# Patient Record
Sex: Female | Born: 1965 | Race: White | Hispanic: No | Marital: Married | Smoking: Never smoker
Health system: Southern US, Community
[De-identification: ages and names within clinical notes are randomized; demographics above are authoritative.]

## PROBLEM LIST (undated history)

## (undated) DIAGNOSIS — I4729 Other ventricular tachycardia: Secondary | ICD-10-CM

## (undated) DIAGNOSIS — F419 Anxiety disorder, unspecified: Secondary | ICD-10-CM

## (undated) DIAGNOSIS — J45909 Unspecified asthma, uncomplicated: Secondary | ICD-10-CM

## (undated) DIAGNOSIS — T753XXA Motion sickness, initial encounter: Secondary | ICD-10-CM

## (undated) DIAGNOSIS — I472 Ventricular tachycardia: Secondary | ICD-10-CM

## (undated) DIAGNOSIS — K219 Gastro-esophageal reflux disease without esophagitis: Secondary | ICD-10-CM

## (undated) DIAGNOSIS — R002 Palpitations: Secondary | ICD-10-CM

## (undated) DIAGNOSIS — I10 Essential (primary) hypertension: Secondary | ICD-10-CM

## (undated) DIAGNOSIS — Z8489 Family history of other specified conditions: Secondary | ICD-10-CM

## (undated) HISTORY — PX: CERVICAL FUSION: SHX112

## (undated) HISTORY — PX: GASTRIC BYPASS: SHX52

## (undated) HISTORY — PX: CHOLECYSTECTOMY: SHX55

---

## 2006-07-29 ENCOUNTER — Ambulatory Visit: Payer: Self-pay | Admitting: Unknown Physician Specialty

## 2007-10-11 ENCOUNTER — Ambulatory Visit: Payer: Self-pay | Admitting: Unknown Physician Specialty

## 2008-12-04 ENCOUNTER — Ambulatory Visit: Payer: Self-pay | Admitting: Unknown Physician Specialty

## 2009-12-11 ENCOUNTER — Ambulatory Visit: Payer: Self-pay | Admitting: Unknown Physician Specialty

## 2011-01-06 ENCOUNTER — Ambulatory Visit: Payer: Self-pay | Admitting: Unknown Physician Specialty

## 2012-01-13 ENCOUNTER — Ambulatory Visit: Payer: Self-pay | Admitting: Obstetrics and Gynecology

## 2013-01-08 DIAGNOSIS — E669 Obesity, unspecified: Secondary | ICD-10-CM | POA: Insufficient documentation

## 2015-04-03 LAB — HM PAP SMEAR: HM Pap smear: NEGATIVE

## 2015-09-25 DIAGNOSIS — M542 Cervicalgia: Secondary | ICD-10-CM | POA: Insufficient documentation

## 2015-09-25 DIAGNOSIS — R519 Headache, unspecified: Secondary | ICD-10-CM | POA: Insufficient documentation

## 2015-09-25 DIAGNOSIS — R030 Elevated blood-pressure reading, without diagnosis of hypertension: Secondary | ICD-10-CM | POA: Insufficient documentation

## 2016-12-30 DIAGNOSIS — R002 Palpitations: Secondary | ICD-10-CM | POA: Insufficient documentation

## 2017-08-17 ENCOUNTER — Ambulatory Visit
Admission: EM | Admit: 2017-08-17 | Discharge: 2017-08-17 | Disposition: A | Discharge status: Home / Self Care | Payer: BLUE CROSS/BLUE SHIELD | Attending: Family Medicine | Admitting: Family Medicine

## 2017-08-17 DIAGNOSIS — W268XXA Contact with other sharp object(s), not elsewhere classified, initial encounter: Secondary | ICD-10-CM | POA: Diagnosis not present

## 2017-08-17 DIAGNOSIS — S61216S Laceration without foreign body of right little finger without damage to nail, sequela: Secondary | ICD-10-CM

## 2017-08-17 HISTORY — DX: Other ventricular tachycardia: I47.29

## 2017-08-17 HISTORY — DX: Palpitations: R00.2

## 2017-08-17 HISTORY — DX: Ventricular tachycardia: I47.2

## 2017-08-17 HISTORY — DX: Unspecified asthma, uncomplicated: J45.909

## 2017-08-17 HISTORY — DX: Essential (primary) hypertension: I10

## 2017-08-17 HISTORY — DX: Anxiety disorder, unspecified: F41.9

## 2017-08-17 MED ORDER — LIDOCAINE-EPINEPHRINE-TETRACAINE (LET) SOLUTION
3.0000 mL | Freq: Once | NASAL | Status: AC
  Administered 2017-08-17: 3 mL via TOPICAL

## 2017-08-17 NOTE — ED Triage Notes (Signed)
Patient was slicing zucchini on a mandolin tonight and cut the tip of her right pinky finger. Patient states that area is continuing to bleed.

## 2017-08-17 NOTE — ED Provider Notes (Signed)
[facility]    CSN: [id number] Arrival date & time: 08/17/17  1819     History   Chief Complaint Chief Complaint  Patient presents with  . Extremity Laceration    HPI AHLAM PISCITELLI is a 51 y.o. female.   HPI  Subjective 51 year old female who is accompanied by her husband. She states she was slicing zucchini on a mandolin tonight she inadvertently cut off the ulnar aspect of her officially of the epidermis of her right fifth finger. The area keeps bleeding despite elevation and pressure. She does have a tissue loss in that area.       Past Medical History:  Diagnosis Date  . Anxiety   . Asthma   . Heart palpitations   . Hypertension   . NSVT (nonsustained ventricular tachycardia) (HCC)     There are no active problems to display for this patient.   History reviewed. No pertinent surgical history.  OB History    No data available       Home Medications    Prior to Admission medications   Medication Sig Start Date End Date Taking? Authorizing Provider  albuterol (PROVENTIL HFA;VENTOLIN HFA) 108 (90 Base) MCG/ACT inhaler Inhale 1-2 puffs into the lungs every 6 (six) hours as needed for wheezing or shortness of breath.   Yes [provider]  cholecalciferol (VITAMIN D) 1000 units tablet Take 2,000 Units by mouth daily.   Yes [provider]  cyclobenzaprine (FLEXERIL) 10 MG tablet Take 5 mg by mouth 3 (three) times daily as needed for muscle spasms.   Yes [provider]  DULoxetine (CYMBALTA) 60 MG capsule Take 60 mg by mouth daily.   Yes [provider]  Fluticasone-Salmeterol (ADVAIR) 250-50 MCG/DOSE AEPB Inhale 1 puff into the lungs 2 (two) times daily.   Yes [provider]  LORazepam (ATIVAN) 0.5 MG tablet Take 0.5 mg by mouth every 4 (four) hours as needed for anxiety.   Yes [provider]  metoprolol succinate (TOPROL-XL) 25 MG 24 hr tablet Take 12.5 mg by mouth daily.   Yes [provider]  Multiple Vitamin (MULTIVITAMIN) tablet Take 1 tablet by mouth daily.   Yes [provider]  ranitidine (ZANTAC) 150 MG capsule Take 150 mg by mouth 2 (two) times daily.   Yes [provider]  vitamin B-12 (CYANOCOBALAMIN) 1000 MCG tablet Take 1,000 mcg by mouth daily.   Yes [provider]    Family History History reviewed. No pertinent family history.  Social History Social History  Substance Use Topics  . Smoking status: Never Smoker  . Smokeless tobacco: Never Used  . Alcohol use No     Allergies   Patient has no known allergies.   Review of Systems Review of Systems  Constitutional: Positive for activity change. Negative for appetite change, chills, fatigue and fever.  Skin: Positive for wound.  All other systems reviewed and are negative.    Physical Exam Triage Vital Signs ED Triage Vitals  Enc Vitals Group     BP 08/17/17 1835 126/69     Pulse Rate 08/17/17 1835 76     Resp 08/17/17 1835 16     Temp 08/17/17 1835 98.1 F (36.7 C)     Temp Source 08/17/17 1835 Oral     SpO2 08/17/17 1835 100 %     Weight --      Height --      Head Circumference --      Peak  Flow --      Pain Score 08/17/17 1834 8     Pain Loc --      Pain Edu? --      Excl. in GC? --    No data found.   Updated Vital Signs BP 126/69 (BP Location: Left Arm)   Pulse 76   Temp 98.1 F (36.7 C) (Oral)   Resp 16   SpO2 100%   Visual Acuity Right Eye Distance:   Left Eye Distance:   Bilateral Distance:    Right Eye Near:   Left Eye Near:    Bilateral Near:     Physical Exam  Constitutional: She is oriented to person, place, and time. She appears well-developed and well-nourished. No distress.  HENT:  Head: Normocephalic.  Eyes: Pupils are equal, round, and reactive to light.  Neck: Normal range of motion.  Musculoskeletal: Normal range of motion.  Neurological: She is alert and oriented to person, place, and time.  Skin: Skin is  warm and dry. She is not diaphoretic.  Examination of the right dominant little finger shows a small avulsion laceration of the distal ulnar volar tip measuring roughly 7 mm.x 2 mm. the dermis is intact. There is tissue loss that is not recoverable.  Psychiatric: She has a normal mood and affect. Her behavior is normal. Judgment and thought content normal.  Nursing note and vitals reviewed.    UC Treatments / Results  Labs (all labs ordered are listed, but only abnormal results are displayed) Labs Reviewed - No data to display  EKG  EKG Interpretation None       Radiology No results found.  Procedures Procedures (including critical care time) Bleeding was attempted to be controlled with elevation and pressure. This was held for a full ten- 15 minutes without success. LET was then applied and held in place for approximately 20 minutes with complete resolution of the bleeding. The bleeding was controlled the wound was covered with Dermabond out to dry. A dry sterile dressing was then applied which the patient will use as necessary.      Medications Ordered in UC Medications  lidocaine-EPINEPHrine-tetracaine (LET) solution (3 mLs Topical Given 08/17/17 1951)     Initial Impression / Assessment and Plan / UC Course  I have reviewed the triage vital signs and the nursing notes.  Pertinent labs & imaging results that were available during my care of the patient were reviewed by me and considered in my medical decision making (see chart for details).     Plan: 1. Test/x-ray results and diagnosis reviewed with patient 2. rx as per orders; risks, benefits, potential side effects reviewed with patient 3. Recommend supportive treatment with allowing the Dermabond to shed on its own. Develops any signs or symptoms of infection return to our clinic immediately. 4. F/u prn if symptoms worsen or don't improve   Final Clinical Impressions(s) / UC Diagnoses   Final diagnoses:    Laceration of right little finger without foreign body without damage to nail, sequela    New Prescriptions Discharge Medication List as of 08/17/2017  8:16 PM       Controlled Substance Prescriptions [place] Controlled Substance Registry consulted? Not Applicable   Lutricia FeilRoemer, Neave Lenger P, PA-C 08/17/17 2040

## 2017-10-10 ENCOUNTER — Encounter: Payer: Self-pay | Admitting: Emergency Medicine

## 2017-10-10 ENCOUNTER — Other Ambulatory Visit: Payer: Self-pay

## 2017-10-10 ENCOUNTER — Emergency Department: Payer: BLUE CROSS/BLUE SHIELD

## 2017-10-10 ENCOUNTER — Emergency Department
Admission: EM | Admit: 2017-10-10 | Discharge: 2017-10-10 | Disposition: A | Discharge status: Home / Self Care | Payer: BLUE CROSS/BLUE SHIELD | Attending: Emergency Medicine | Admitting: Emergency Medicine

## 2017-10-10 DIAGNOSIS — Y999 Unspecified external cause status: Secondary | ICD-10-CM | POA: Diagnosis not present

## 2017-10-10 DIAGNOSIS — Y929 Unspecified place or not applicable: Secondary | ICD-10-CM | POA: Diagnosis not present

## 2017-10-10 DIAGNOSIS — Z79899 Other long term (current) drug therapy: Secondary | ICD-10-CM | POA: Diagnosis not present

## 2017-10-10 DIAGNOSIS — S161XXA Strain of muscle, fascia and tendon at neck level, initial encounter: Secondary | ICD-10-CM

## 2017-10-10 DIAGNOSIS — M25512 Pain in left shoulder: Secondary | ICD-10-CM | POA: Insufficient documentation

## 2017-10-10 DIAGNOSIS — J45909 Unspecified asthma, uncomplicated: Secondary | ICD-10-CM | POA: Insufficient documentation

## 2017-10-10 DIAGNOSIS — I10 Essential (primary) hypertension: Secondary | ICD-10-CM | POA: Insufficient documentation

## 2017-10-10 DIAGNOSIS — G44319 Acute post-traumatic headache, not intractable: Secondary | ICD-10-CM

## 2017-10-10 DIAGNOSIS — R42 Dizziness and giddiness: Secondary | ICD-10-CM | POA: Insufficient documentation

## 2017-10-10 DIAGNOSIS — S199XXA Unspecified injury of neck, initial encounter: Secondary | ICD-10-CM | POA: Diagnosis present

## 2017-10-10 DIAGNOSIS — Y9389 Activity, other specified: Secondary | ICD-10-CM | POA: Diagnosis not present

## 2017-10-10 MED ORDER — METHOCARBAMOL 500 MG PO TABS
750.0000 mg | ORAL_TABLET | Freq: Once | ORAL | Status: AC
Start: 2017-10-10 — End: 2017-10-10
  Administered 2017-10-10: 750 mg via ORAL
  Filled 2017-10-10: qty 2

## 2017-10-10 MED ORDER — METHOCARBAMOL 500 MG PO TABS: ORAL_TABLET | ORAL | 0 refills | Status: DC

## 2017-10-10 MED ORDER — HYDROCODONE-ACETAMINOPHEN 5-325 MG PO TABS: 1.0000 | ORAL_TABLET | Freq: Four times a day (QID) | ORAL | 0 refills | Status: DC | PRN

## 2017-10-10 MED ORDER — HYDROCODONE-ACETAMINOPHEN 5-325 MG PO TABS
1.0000 | ORAL_TABLET | Freq: Once | ORAL | Status: AC
  Administered 2017-10-10: 1 via ORAL
  Filled 2017-10-10: qty 1
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
## 2017-10-10 NOTE — Discharge Instructions (Signed)
Follow up with your  primary care provider if any continued problems. Begin taking Norco as needed for pain. Robaxin 1 or 2 tablets every 6 hours as needed for muscle spasms. You  may also use ice or heat to your  neck as needed for discomfort. Return to the emergency department if any severe worsening of your symptoms.
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
## 2017-10-10 NOTE — ED Notes (Signed)
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
## 2018-01-29 ENCOUNTER — Ambulatory Visit
Admission: EM | Admit: 2018-01-29 | Discharge: 2018-01-29 | Disposition: A | Discharge status: Home / Self Care | Payer: BLUE CROSS/BLUE SHIELD | Attending: Family Medicine | Admitting: Family Medicine

## 2018-01-29 ENCOUNTER — Other Ambulatory Visit: Payer: Self-pay

## 2018-01-29 DIAGNOSIS — B349 Viral infection, unspecified: Secondary | ICD-10-CM

## 2018-01-29 DIAGNOSIS — R05 Cough: Secondary | ICD-10-CM

## 2018-01-29 DIAGNOSIS — R067 Sneezing: Secondary | ICD-10-CM | POA: Diagnosis not present

## 2018-01-29 DIAGNOSIS — R51 Headache: Secondary | ICD-10-CM | POA: Diagnosis not present

## 2018-01-29 LAB — RAPID INFLUENZA A&B ANTIGENS
Influenza A (ARMC): NEGATIVE
Influenza B (ARMC): NEGATIVE

## 2018-01-29 NOTE — ED Provider Notes (Signed)
[facility]    CSN: [id number] Arrival date & time: 01/29/18  1306  History   Chief Complaint Chief Complaint  Patient presents with  . Generalized Body Aches   HPI  52 year old female presents with body aches and respiratory symptoms.  Patient states that her symptoms started on Thursday.  She states that she has had headache, body aches, cough, sneezing.  No fever.  She has been taking DayQuil and NyQuil with improvement.  She is concerned about influenza.  No known exacerbating factors.  Her body aches are moderate to severe. No other associated symptoms.  No other complaints at this time.  Social History Social History   Tobacco Use  . Smoking status: Never Smoker  . Smokeless tobacco: Never Used  Substance Use Topics  . Alcohol use: No  . Drug use: No   Allergies   Patient has no known allergies.   Review of Systems Review of Systems  Constitutional: Negative for fever.  HENT: Positive for sneezing.   Respiratory: Positive for cough.   Musculoskeletal:       Bodyaches.  Neurological: Positive for headaches.   Physical Exam Triage Vital Signs ED Triage Vitals  Enc Vitals Group     BP 01/29/18 1322 (!) 132/59     Pulse Rate 01/29/18 1322 81     Resp --      Temp 01/29/18 1322 98.1 F (36.7 C)     Temp Source 01/29/18 1322 Oral     SpO2 01/29/18 1322 98 %     Weight 01/29/18 1319 295 lb (133.8 kg)     Height 01/29/18 1319 5\' 9"  (1.753 m)     Head Circumference --      Peak Flow --      Pain Score 01/29/18 1318 3     Pain Loc --      Pain Edu? --      Excl. in GC? --    Updated Vital Signs BP (!) 132/59 (BP Location: Left Arm)   Pulse 81   Temp 98.1 F (36.7 C) (Oral)   Ht 5\' 9"  (1.753 m)   Wt 295 lb (133.8 kg)   SpO2 98%   BMI 43.56 kg/m     Physical Exam  Constitutional: She is oriented to person, place, and time. She appears well-developed. No distress.  HENT:  Head: Normocephalic and atraumatic.  Mouth/Throat: Oropharynx is  clear and moist.  Normal TM's.  Eyes: Conjunctivae are normal. Right eye exhibits no discharge. Left eye exhibits no discharge.  Neck: Neck supple.  Cardiovascular: Normal rate and regular rhythm.  Pulmonary/Chest: Effort normal. She has no wheezes. She has no rales.  Lymphadenopathy:    She has no cervical adenopathy.  Neurological: She is alert and oriented to person, place, and time.  Psychiatric: She has a normal mood and affect. Her behavior is normal.  Nursing note and vitals reviewed.  UC Treatments / Results  Labs (all labs ordered are listed, but only abnormal results are displayed) Labs Reviewed  RAPID INFLUENZA A&B ANTIGENS ([facility] ONLY)   EKG None Radiology No results found.  Procedures Procedures (including critical care time)  Medications Ordered in UC Medications - No data to display   Initial Impression / Assessment and Plan / UC Course  I have reviewed the triage vital signs and the nursing notes.  Pertinent labs & imaging results that were available during my care of the patient were reviewed by me and considered in my medical decision making (  see chart for details).    52 year old female presents with a viral illness.  Rapid flu negative.  Well-appearing.  Exam unremarkable.  Advised continue use of DayQuil and NyQuil.  Supportive care.  Final Clinical Impressions(s) / UC Diagnoses   Final diagnoses:  Viral illness    ED Discharge Orders    None     Controlled Substance Prescriptions  Controlled Substance Registry consulted? Not Applicable   Tommie SamsCook, Nixxon Faria G, DO 01/29/18 1436

## 2018-01-29 NOTE — ED Triage Notes (Signed)
Patient has been experiencing body aches, chills,congestion and cough since Thursday.

## 2018-03-02 DIAGNOSIS — S93402A Sprain of unspecified ligament of left ankle, initial encounter: Secondary | ICD-10-CM | POA: Insufficient documentation

## 2018-04-18 HISTORY — PX: ANKLE ARTHROSCOPY: SUR85

## 2018-05-09 ENCOUNTER — Other Ambulatory Visit (HOSPITAL_COMMUNITY)
Admission: RE | Admit: 2018-05-09 | Discharge: 2018-05-09 | Disposition: A | Discharge status: Home / Self Care | Payer: BLUE CROSS/BLUE SHIELD | Source: Ambulatory Visit | Attending: Obstetrics and Gynecology | Admitting: Obstetrics and Gynecology

## 2018-05-09 ENCOUNTER — Encounter: Payer: Self-pay | Admitting: Obstetrics and Gynecology

## 2018-05-09 ENCOUNTER — Ambulatory Visit (INDEPENDENT_AMBULATORY_CARE_PROVIDER_SITE_OTHER): Payer: BLUE CROSS/BLUE SHIELD | Admitting: Obstetrics and Gynecology

## 2018-05-09 VITALS — BP 108/62 | HR 80 | Ht 69.0 in | Wt 285.0 lb

## 2018-05-09 DIAGNOSIS — Z1339 Encounter for screening examination for other mental health and behavioral disorders: Secondary | ICD-10-CM

## 2018-05-09 DIAGNOSIS — Z01419 Encounter for gynecological examination (general) (routine) without abnormal findings: Secondary | ICD-10-CM | POA: Diagnosis not present

## 2018-05-09 DIAGNOSIS — Z1211 Encounter for screening for malignant neoplasm of colon: Secondary | ICD-10-CM | POA: Diagnosis not present

## 2018-05-09 DIAGNOSIS — Z1331 Encounter for screening for depression: Secondary | ICD-10-CM

## 2018-05-09 DIAGNOSIS — Z124 Encounter for screening for malignant neoplasm of cervix: Secondary | ICD-10-CM

## 2018-05-09 NOTE — Progress Notes (Signed)
Routine Annual Gynecology Examination   PCP: Leim FabryAldridge, Barbara, MD  Chief Complaint  Patient presents with  . Gynecologic Exam   History of Present Illness: Patient is a 52 y.o. G2P2002 presents for annual exam. The patient has no complaints today.   Menopausal bleeding: denies  Menopausal symptoms: denies  Breast symptoms: denies  Last pap smear: 3 years ago.  Result Normal  Last mammogram: 3 years ago.  Result Normal   The patient is sexually active. She has no issues with intercourse.  Last colonoscopy: never had.    Past Medical History:  Diagnosis Date  . Anxiety   . Asthma   . Heart palpitations   . Hypertension   . NSVT (nonsustained ventricular tachycardia) (HCC)    Past Surgical History:  Procedure Laterality Date  . ANKLE ARTHROSCOPY Left 04/18/2018  . CERVICAL FUSION    . CHOLECYSTECTOMY    . GASTRIC BYPASS     Prior to Admission medications   Medication Sig Start Date End Date Taking? Authorizing Provider  cholecalciferol (VITAMIN D) 1000 units tablet Take 2,000 Units by mouth daily.   Yes [provider]  DULoxetine (CYMBALTA) 60 MG capsule Take 60 mg by mouth daily.   Yes [provider]  LORazepam (ATIVAN) 0.5 MG tablet Take 0.5 mg by mouth every 4 (four) hours as needed for anxiety.   Yes [provider]  metoprolol succinate (TOPROL-XL) 25 MG 24 hr tablet Take 12.5 mg by mouth daily.   Yes [provider]  Multiple Vitamin (MULTIVITAMIN) tablet Take 1 tablet by mouth daily.   Yes [provider]  ranitidine (ZANTAC) 150 MG capsule Take 150 mg by mouth 2 (two) times daily.   Yes [provider]  vitamin B-12 (CYANOCOBALAMIN) 1000 MCG tablet Take 1,000 mcg by mouth daily.   Yes [provider]  albuterol (PROVENTIL HFA;VENTOLIN HFA) 108 (90 Base) MCG/ACT inhaler Inhale 1-2 puffs into the lungs every 6 (six) hours as needed for wheezing or shortness of breath.    [provider]    Fluticasone-Salmeterol (ADVAIR) 250-50 MCG/DOSE AEPB Inhale 1 puff into the lungs 2 (two) times daily.    [provider]   Allergies: No Known Allergies  Obstetric History: [id number], s/p SVD.   Social History   Socioeconomic History  . Marital status: Married    Spouse name: Not on file  . Number of children: Not on file  . Years of education: Not on file  . Highest education level: Not on file  Occupational History  . Not on file  Social Needs  . Financial resource strain: Not on file  . Food insecurity:    Worry: Not on file    Inability: Not on file  . Transportation needs:    Medical: Not on file    Non-medical: Not on file  Tobacco Use  . Smoking status: Never Smoker  . Smokeless tobacco: Never Used  Substance and Sexual Activity  . Alcohol use: No  . Drug use: No  . Sexual activity: Not on file  Lifestyle  . Physical activity:    Days per week: Not on file    Minutes per session: Not on file  . Stress: Not on file  Relationships  . Social connections:    Talks on phone: Not on file    Gets together: Not on file    Attends religious service: Not on file    Active member of club or organization: Not on file  Attends meetings of clubs or organizations: Not on file    Relationship status: Not on file  . Intimate partner violence:    Fear of current or ex partner: Not on file    Emotionally abused: Not on file    Physically abused: Not on file    Forced sexual activity: Not on file  Other Topics Concern  . Not on file  Social History Narrative  . Not on file    Family History  Problem Relation Age of Onset  . Breast cancer Mother     Review of Systems  Constitutional: Negative.   HENT: Negative.   Eyes: Negative.   Respiratory: Negative.   Cardiovascular: Negative.   Gastrointestinal: Negative.   Genitourinary: Negative.   Musculoskeletal: Negative.   Skin: Negative.   Neurological: Negative.   Psychiatric/Behavioral: Negative.      Physical Exam Vitals: BP 108/62 (BP Location: Left Arm, Patient Position: Sitting, Cuff Size: Large)   Pulse 80   Ht 5\' 9"  (1.753 m)   Wt 285 lb (129.3 kg)   SpO2 96%   BMI 42.09 kg/m   Physical Exam  Constitutional: She is oriented to person, place, and time. She appears well-developed and well-nourished. No distress.  Genitourinary: Uterus normal. Pelvic exam was performed with patient supine. There is no rash, tenderness, lesion or injury on the right labia. There is no rash, tenderness, lesion or injury on the left labia. No erythema, tenderness or bleeding in the vagina. No signs of injury around the vagina. No vaginal discharge found. Right adnexum does not display mass, does not display tenderness and does not display fullness. Left adnexum does not display mass, does not display tenderness and does not display fullness. Cervix does not exhibit motion tenderness, lesion, discharge or polyp.   Uterus is mobile and anteverted. Uterus is not enlarged, tender or exhibiting a mass.  HENT:  Head: Normocephalic and atraumatic.  Eyes: EOM are normal. No scleral icterus.  Neck: Normal range of motion. Neck supple. No thyromegaly present.  Cardiovascular: Normal rate and regular rhythm. Exam reveals no gallop and no friction rub.  No murmur heard. Pulmonary/Chest: Effort normal and breath sounds normal. No respiratory distress. She has no wheezes. She has no rales. Right breast exhibits no inverted nipple, no mass, no nipple discharge, no skin change and no tenderness. Left breast exhibits no inverted nipple, no mass, no nipple discharge, no skin change and no tenderness.  Abdominal: Soft. Bowel sounds are normal. She exhibits no distension and no mass. There is no tenderness. There is no rebound and no guarding.  Musculoskeletal: Normal range of motion. She exhibits no edema or tenderness.  Lymphadenopathy:    She has no cervical adenopathy.       Right: No inguinal adenopathy present.        Left: No inguinal adenopathy present.  Neurological: She is alert and oriented to person, place, and time. No cranial nerve deficit.  Skin: Skin is warm and dry. No rash noted. No erythema.  Psychiatric: She has a normal mood and affect. Her behavior is normal. Judgment normal.   Female chaperone present for pelvic and breast  portions of the physical exam  Results: AUDIT Questionnaire (screen for alcoholism): 0 PHQ-9: 0  Assessment and Plan:  52 y.o. G59P2002 female here for routine annual gynecologic examination  Plan: Problem List Items Addressed This Visit    None    Visit Diagnoses    Women's annual routine gynecological examination    -  Primary   Screening for depression       Screening for alcoholism       Pap smear for cervical cancer screening          Screening: -- Blood pressure screen normal -- Colonoscopy - due - will schedule -- Mammogram - due. Patient to call [facility] to arrange. She understands that it is her responsibility to arrange this. -- Weight screening: obese: discussed management options, including lifestyle, dietary, and exercise. -- Depression screening negative (PHQ-9) -- Nutrition: normal -- cholesterol screening: per PCP -- osteoporosis screening: not due -- tobacco screening: not using -- alcohol screening: AUDIT questionnaire indicates low-risk usage. -- family history of breast cancer screening: done. not at high risk. -- no evidence of domestic violence or intimate partner violence. -- STD screening: gonorrhea/chlamydia NAAT not collected per patient request. -- pap smear collected per ASCCP guidelines -- HPV vaccination series: not eligilbe   Thomasene Mohair, MD 05/09/2018 9:05 AM

## 2018-05-10 LAB — CYTOLOGY - PAP
Diagnosis: NEGATIVE
HPV (WINDOPATH): NOT DETECTED

## 2018-05-11 ENCOUNTER — Other Ambulatory Visit: Payer: Self-pay

## 2018-05-11 ENCOUNTER — Telehealth: Payer: Self-pay | Admitting: Gastroenterology

## 2018-05-11 DIAGNOSIS — Z1211 Encounter for screening for malignant neoplasm of colon: Secondary | ICD-10-CM

## 2018-05-11 NOTE — Telephone Encounter (Signed)
Gastroenterology Pre-Procedure Review  Request Date:  Requesting Physician: Dr. Servando SnareWohl   PATIENT REVIEW QUESTIONS: The patient responded to the following health history questions as indicated:    1. Are you having any GI issues? no 2. Do you have a personal history of Polyps? no 3. Do you have a family history of Colon Cancer or Polyps? no 4. Diabetes Mellitus? no 5. Joint replacements in the past 12 months?no 6. Major health problems in the past 3 months?no 7. Any artificial heart valves, MVP, or defibrillator?no    MEDICATIONS & ALLERGIES:    Patient reports the following regarding taking any anticoagulation/antiplatelet therapy:   Plavix, Coumadin, Eliquis, Xarelto, Lovenox, Pradaxa, Brilinta, or Effient? NO Aspirin? no  Patient confirms/reports the following medications:  Current Outpatient Medications  Medication Sig Dispense Refill  . albuterol (PROVENTIL HFA;VENTOLIN HFA) 108 (90 Base) MCG/ACT inhaler Inhale 1-2 puffs into the lungs every 6 (six) hours as needed for wheezing or shortness of breath.    . cholecalciferol (VITAMIN D) 1000 units tablet Take 2,000 Units by mouth daily.    . DULoxetine (CYMBALTA) 60 MG capsule Take 60 mg by mouth daily.    . Fluticasone-Salmeterol (ADVAIR) 250-50 MCG/DOSE AEPB Inhale 1 puff into the lungs 2 (two) times daily.    Marland Kitchen. LORazepam (ATIVAN) 0.5 MG tablet Take 0.5 mg by mouth every 4 (four) hours as needed for anxiety.    . metoprolol succinate (TOPROL-XL) 25 MG 24 hr tablet Take 12.5 mg by mouth daily.    . Multiple Vitamin (MULTIVITAMIN) tablet Take 1 tablet by mouth daily.    . ranitidine (ZANTAC) 150 MG capsule Take 150 mg by mouth 2 (two) times daily.    . vitamin B-12 (CYANOCOBALAMIN) 1000 MCG tablet Take 1,000 mcg by mouth daily.     No current facility-administered medications for this visit.     Patient confirms/reports the following allergies:  No Known Allergies  No orders of the defined types were placed in this  encounter.   AUTHORIZATION INFORMATION Primary Insurance: 1D#: Group #:  Secondary Insurance: 1D#: Group #:  SCHEDULE INFORMATION: Date: 06/24/2018 Time: Location:[facility]

## 2018-05-12 DIAGNOSIS — M9320 Osteochondritis dissecans of unspecified site: Secondary | ICD-10-CM | POA: Insufficient documentation

## 2018-05-13 ENCOUNTER — Other Ambulatory Visit: Payer: Self-pay

## 2018-05-23 ENCOUNTER — Other Ambulatory Visit: Payer: Self-pay | Admitting: Obstetrics and Gynecology

## 2018-05-23 DIAGNOSIS — Z1231 Encounter for screening mammogram for malignant neoplasm of breast: Secondary | ICD-10-CM

## 2018-05-25 ENCOUNTER — Ambulatory Visit
Admission: RE | Admit: 2018-05-25 | Discharge: 2018-05-25 | Disposition: A | Discharge status: Home / Self Care | Payer: BLUE CROSS/BLUE SHIELD | Source: Ambulatory Visit | Attending: Obstetrics and Gynecology | Admitting: Obstetrics and Gynecology

## 2018-05-25 DIAGNOSIS — Z1231 Encounter for screening mammogram for malignant neoplasm of breast: Secondary | ICD-10-CM | POA: Insufficient documentation

## 2018-06-01 ENCOUNTER — Other Ambulatory Visit: Payer: Self-pay | Admitting: Obstetrics and Gynecology

## 2018-06-01 DIAGNOSIS — N6320 Unspecified lump in the left breast, unspecified quadrant: Secondary | ICD-10-CM

## 2018-06-01 DIAGNOSIS — R928 Other abnormal and inconclusive findings on diagnostic imaging of breast: Secondary | ICD-10-CM

## 2018-06-06 ENCOUNTER — Ambulatory Visit
Admission: RE | Admit: 2018-06-06 | Discharge: 2018-06-06 | Disposition: A | Discharge status: Home / Self Care | Payer: BLUE CROSS/BLUE SHIELD | Source: Ambulatory Visit | Attending: Obstetrics and Gynecology | Admitting: Obstetrics and Gynecology

## 2018-06-06 ENCOUNTER — Other Ambulatory Visit: Payer: Self-pay | Admitting: Obstetrics and Gynecology

## 2018-06-06 DIAGNOSIS — N6320 Unspecified lump in the left breast, unspecified quadrant: Secondary | ICD-10-CM | POA: Insufficient documentation

## 2018-06-06 DIAGNOSIS — R928 Other abnormal and inconclusive findings on diagnostic imaging of breast: Secondary | ICD-10-CM | POA: Insufficient documentation

## 2018-06-16 ENCOUNTER — Encounter: Payer: Self-pay | Admitting: *Deleted

## 2018-06-16 ENCOUNTER — Other Ambulatory Visit: Payer: Self-pay

## 2018-06-23 NOTE — Discharge Instructions (Signed)
General Anesthesia, Adult, Care After °These instructions provide you with information about caring for yourself after your procedure. Your health care provider may also give you more specific instructions. Your treatment has been planned according to current medical practices, but problems sometimes occur. Call your health care provider if you have any problems or questions after your procedure. °What can I expect after the procedure? °After the procedure, it is common to have: °· Vomiting. °· A sore throat. °· Mental slowness. ° °It is common to feel: °· Nauseous. °· Cold or shivery. °· Sleepy. °· Tired. °· Sore or achy, even in parts of your body where you did not have surgery. ° °Follow these instructions at home: °For at least 24 hours after the procedure: °· Do not: °? Participate in activities where you could fall or become injured. °? Drive. °? Use heavy machinery. °? Drink alcohol. °? Take sleeping pills or medicines that cause drowsiness. °? Make important decisions or sign legal documents. °? Take care of children on your own. °· Rest. °Eating and drinking °· If you vomit, drink water, juice, or soup when you can drink without vomiting. °· Drink enough fluid to keep your urine clear or pale yellow. °· Make sure you have little or no nausea before eating solid foods. °· Follow the diet recommended by your health care provider. °General instructions °· Have a responsible adult stay with you until you are awake and alert. °· Return to your normal activities as told by your health care provider. Ask your health care provider what activities are safe for you. °· Take over-the-counter and prescription medicines only as told by your health care provider. °· If you smoke, do not smoke without supervision. °· Keep all follow-up visits as told by your health care provider. This is important. °Contact a health care provider if: °· You continue to have nausea or vomiting at home, and medicines are not helpful. °· You  cannot drink fluids or start eating again. °· You cannot urinate after 8-12 hours. °· You develop a skin rash. °· You have fever. °· You have increasing redness at the site of your procedure. °Get help right away if: °· You have difficulty breathing. °· You have chest pain. °· You have unexpected bleeding. °· You feel that you are having a life-threatening or urgent problem. °This information is not intended to replace advice given to you by your health care provider. Make sure you discuss any questions you have with your health care provider. °Document Released: 01/18/2001 Document Revised: 03/16/2016 Document Reviewed: 09/26/2015 °Elsevier Interactive Patient Education © 2018 [employer] ° °

## 2018-06-24 ENCOUNTER — Ambulatory Visit: Payer: BLUE CROSS/BLUE SHIELD | Admitting: Anesthesiology

## 2018-06-24 ENCOUNTER — Encounter
Admission: RE | Disposition: A | Discharge status: Home / Self Care | Payer: Self-pay | Source: Ambulatory Visit | Attending: Gastroenterology

## 2018-06-24 ENCOUNTER — Ambulatory Visit
Admission: RE | Admit: 2018-06-24 | Discharge: 2018-06-24 | Disposition: A | Discharge status: Home / Self Care | Payer: BLUE CROSS/BLUE SHIELD | Source: Ambulatory Visit | Attending: Gastroenterology | Admitting: Gastroenterology

## 2018-06-24 DIAGNOSIS — Z79899 Other long term (current) drug therapy: Secondary | ICD-10-CM | POA: Diagnosis not present

## 2018-06-24 DIAGNOSIS — Z9884 Bariatric surgery status: Secondary | ICD-10-CM | POA: Insufficient documentation

## 2018-06-24 DIAGNOSIS — Z9049 Acquired absence of other specified parts of digestive tract: Secondary | ICD-10-CM | POA: Insufficient documentation

## 2018-06-24 DIAGNOSIS — Z1211 Encounter for screening for malignant neoplasm of colon: Secondary | ICD-10-CM | POA: Insufficient documentation

## 2018-06-24 DIAGNOSIS — Z981 Arthrodesis status: Secondary | ICD-10-CM | POA: Diagnosis not present

## 2018-06-24 DIAGNOSIS — J45909 Unspecified asthma, uncomplicated: Secondary | ICD-10-CM | POA: Diagnosis not present

## 2018-06-24 DIAGNOSIS — Z803 Family history of malignant neoplasm of breast: Secondary | ICD-10-CM | POA: Diagnosis not present

## 2018-06-24 DIAGNOSIS — I10 Essential (primary) hypertension: Secondary | ICD-10-CM | POA: Diagnosis not present

## 2018-06-24 DIAGNOSIS — Z7951 Long term (current) use of inhaled steroids: Secondary | ICD-10-CM | POA: Insufficient documentation

## 2018-06-24 DIAGNOSIS — F419 Anxiety disorder, unspecified: Secondary | ICD-10-CM | POA: Insufficient documentation

## 2018-06-24 DIAGNOSIS — K219 Gastro-esophageal reflux disease without esophagitis: Secondary | ICD-10-CM | POA: Insufficient documentation

## 2018-06-24 HISTORY — DX: Gastro-esophageal reflux disease without esophagitis: K21.9

## 2018-06-24 HISTORY — PX: COLONOSCOPY WITH PROPOFOL: SHX5780

## 2018-06-24 HISTORY — DX: Family history of other specified conditions: Z84.89

## 2018-06-24 HISTORY — DX: Motion sickness, initial encounter: T75.3XXA

## 2018-06-24 SURGERY — COLONOSCOPY WITH PROPOFOL
Anesthesia: General | Wound class: Clean Contaminated

## 2018-06-24 MED ORDER — PROPOFOL 10 MG/ML IV BOLUS
INTRAVENOUS | Status: DC | PRN
  Administered 2018-06-24: 30 mg via INTRAVENOUS
  Administered 2018-06-24: 20 mg via INTRAVENOUS
  Administered 2018-06-24 (×2): 30 mg via INTRAVENOUS
  Administered 2018-06-24 (×5): 20 mg via INTRAVENOUS
  Administered 2018-06-24: 100 mg via INTRAVENOUS
  Administered 2018-06-24 (×3): 20 mg via INTRAVENOUS

## 2018-06-24 MED ORDER — SODIUM CHLORIDE 0.9 % IV SOLN: INTRAVENOUS | Status: DC

## 2018-06-24 MED ORDER — ACETAMINOPHEN 160 MG/5ML PO SOLN: 325.0000 mg | ORAL | Status: DC | PRN

## 2018-06-24 MED ORDER — LACTATED RINGERS IV SOLN
INTRAVENOUS | Status: DC
  Administered 2018-06-24: 10:00:00 via INTRAVENOUS

## 2018-06-24 MED ORDER — LIDOCAINE HCL (CARDIAC) PF 100 MG/5ML IV SOSY
PREFILLED_SYRINGE | INTRAVENOUS | Status: DC | PRN
  Administered 2018-06-24: 40 mg via INTRAVENOUS

## 2018-06-24 MED ORDER — STERILE WATER FOR IRRIGATION IR SOLN
Status: DC | PRN
  Administered 2018-06-24: 10:00:00

## 2018-06-24 MED ORDER — ACETAMINOPHEN 325 MG PO TABS: 325.0000 mg | ORAL_TABLET | ORAL | Status: DC | PRN

## 2018-06-24 SURGICAL SUPPLY — 24 items
CANISTER SUCT 1200ML W/VALVE (MISCELLANEOUS) ×2 IMPLANT
CLIP HMST 235XBRD CATH ROT (MISCELLANEOUS) IMPLANT
CLIP RESOLUTION 360 11X235 (MISCELLANEOUS)
ELECT REM PT RETURN 9FT ADLT (ELECTROSURGICAL)
ELECTRODE REM PT RTRN 9FT ADLT (ELECTROSURGICAL) IMPLANT
FCP ESCP3.2XJMB 240X2.8X (MISCELLANEOUS)
FORCEPS BIOP RAD 4 LRG CAP 4 (CUTTING FORCEPS) IMPLANT
FORCEPS BIOP RJ4 240 W/NDL (MISCELLANEOUS)
FORCEPS ESCP3.2XJMB 240X2.8X (MISCELLANEOUS) IMPLANT
GOWN CVR UNV OPN BCK APRN NK (MISCELLANEOUS) ×2 IMPLANT
GOWN ISOL THUMB LOOP REG UNIV (MISCELLANEOUS) ×4
INJECTOR VARIJECT VIN23 (MISCELLANEOUS) IMPLANT
KIT DEFENDO VALVE AND CONN (KITS) IMPLANT
KIT ENDO PROCEDURE OLY (KITS) ×2 IMPLANT
MARKER SPOT ENDO TATTOO 5ML (MISCELLANEOUS) IMPLANT
PROBE APC STR FIRE (PROBE) IMPLANT
RETRIEVER NET ROTH 2.5X230 LF (MISCELLANEOUS) IMPLANT
SNARE SHORT THROW 13M SML OVAL (MISCELLANEOUS) IMPLANT
SNARE SHORT THROW 30M LRG OVAL (MISCELLANEOUS) IMPLANT
SNARE SNG USE RND 15MM (INSTRUMENTS) IMPLANT
SPOT EX ENDOSCOPIC TATTOO (MISCELLANEOUS)
TRAP ETRAP POLY (MISCELLANEOUS) IMPLANT
VARIJECT INJECTOR VIN23 (MISCELLANEOUS)
WATER STERILE IRR 250ML POUR (IV SOLUTION) ×2 IMPLANT

## 2018-06-24 NOTE — Anesthesia Preprocedure Evaluation (Signed)
Anesthesia Evaluation  Patient identified by MRN, date of birth, ID band Patient awake    Reviewed: Allergy & Precautions, H&P , NPO status , Patient's Chart, lab work & pertinent test results, reviewed documented beta blocker date and time   Airway Mallampati: II  TM Distance: >3 FB Neck ROM: full    Dental no notable dental hx.    Pulmonary asthma ,    Pulmonary exam normal breath sounds clear to auscultation       Cardiovascular Exercise Tolerance: Good hypertension,  Rhythm:regular Rate:Normal     Neuro/Psych negative neurological ROS  negative psych ROS   GI/Hepatic Neg liver ROS, GERD  ,  Endo/Other  Morbid obesity  Renal/GU negative Renal ROS  negative genitourinary   Musculoskeletal   Abdominal   Peds  Hematology negative hematology ROS (+)   Anesthesia Other Findings   Reproductive/Obstetrics negative OB ROS                             Anesthesia Physical Anesthesia Plan  ASA: II  Anesthesia Plan: General   Post-op Pain Management:    Induction:   PONV Risk Score and Plan:   Airway Management Planned:   Additional Equipment:   Intra-op Plan:   Post-operative Plan:   Informed Consent: I have reviewed the patients History and Physical, chart, labs and discussed the procedure including the risks, benefits and alternatives for the proposed anesthesia with the patient or authorized representative who has indicated his/her understanding and acceptance.   Dental Advisory Given  Plan Discussed with: CRNA  Anesthesia Plan Comments:         Anesthesia Quick Evaluation

## 2018-06-24 NOTE — H&P (Signed)
Midge Minium, MD [facility] [address] [place], [place] [address] Phone: [phone] Fax : [phone]  Primary Care Physician:  Leim Fabry, MD Primary Gastroenterologist:  Dr. Servando Snare  Pre-Procedure History & Physical: HPI:  Leslie Barnett is a 52 y.o. female is here for a screening colonoscopy.   Past Medical History:  Diagnosis Date  . Anxiety   . Asthma   . Family history of adverse reaction to anesthesia    Mother - PONV  . GERD (gastroesophageal reflux disease)   . Heart palpitations   . Hypertension   . Motion sickness    back seat cars  . NSVT (nonsustained ventricular tachycardia) (HCC)     Past Surgical History:  Procedure Laterality Date  . ANKLE ARTHROSCOPY Left 04/18/2018  . CERVICAL FUSION     C5-6-7  . CHOLECYSTECTOMY    . GASTRIC BYPASS      Prior to Admission medications   Medication Sig Start Date End Date Taking? Authorizing Provider  cholecalciferol (VITAMIN D) 1000 units tablet Take 2,000 Units by mouth daily.   Yes [provider]  DULoxetine (CYMBALTA) 60 MG capsule Take 60 mg by mouth daily.   Yes [provider]  LORazepam (ATIVAN) 0.5 MG tablet Take 0.5 mg by mouth every 4 (four) hours as needed for anxiety.   Yes [provider]  metoprolol succinate (TOPROL-XL) 25 MG 24 hr tablet Take 12.5 mg by mouth daily.   Yes [provider]  Multiple Vitamin (MULTIVITAMIN) tablet Take 1 tablet by mouth daily.   Yes [provider]  ranitidine (ZANTAC) 150 MG capsule Take 150 mg by mouth 2 (two) times daily.   Yes [provider]  vitamin B-12 (CYANOCOBALAMIN) 1000 MCG tablet Take 1,000 mcg by mouth daily.   Yes [provider]  albuterol (PROVENTIL HFA;VENTOLIN HFA) 108 (90 Base) MCG/ACT inhaler Inhale 1-2 puffs into the lungs every 6 (six) hours as needed for wheezing or shortness of breath.    [provider]  Fluticasone-Salmeterol (ADVAIR) 250-50 MCG/DOSE AEPB Inhale 1  puff into the lungs 2 (two) times daily.    [provider]    Allergies as of 05/11/2018  . (No Known Allergies)    Family History  Problem Relation Age of Onset  . Breast cancer Mother 24  . Breast cancer Paternal Grandmother 60    Social History   Socioeconomic History  . Marital status: Married    Spouse name: Not on file  . Number of children: Not on file  . Years of education: Not on file  . Highest education level: Not on file  Occupational History  . Not on file  Social Needs  . Financial resource strain: Not on file  . Food insecurity:    Worry: Not on file    Inability: Not on file  . Transportation needs:    Medical: Not on file    Non-medical: Not on file  Tobacco Use  . Smoking status: Never Smoker  . Smokeless tobacco: Never Used  Substance and Sexual Activity  . Alcohol use: No  . Drug use: No  . Sexual activity: Not on file  Lifestyle  . Physical activity:    Days per week: Not on file    Minutes per session: Not on file  . Stress: Not on file  Relationships  . Social connections:    Talks on phone: Not on file    Gets together: Not on file    Attends religious  service: Not on file    Active member of club or organization: Not on file    Attends meetings of clubs or organizations: Not on file    Relationship status: Not on file  . Intimate partner violence:    Fear of current or ex partner: Not on file    Emotionally abused: Not on file    Physically abused: Not on file    Forced sexual activity: Not on file  Other Topics Concern  . Not on file  Social History Narrative  . Not on file    Review of Systems: See HPI, otherwise negative ROS  Physical Exam: BP 131/65   Pulse 89   Temp 97.7 F (36.5 C) (Temporal)   Ht 5\' 9"  (1.753 m)   Wt 129.7 kg   SpO2 97%   BMI 42.23 kg/m  General:   Alert,  pleasant and cooperative in NAD Head:  Normocephalic and atraumatic. Neck:  Supple; no masses or thyromegaly. Lungs:  Clear  throughout to auscultation.    Heart:  Regular rate and rhythm. Abdomen:  Soft, nontender and nondistended. Normal bowel sounds, without guarding, and without rebound.   Neurologic:  Alert and  oriented x4;  grossly normal neurologically.  Impression/Plan: Leslie Barnett is now here to undergo a screening colonoscopy.  Risks, benefits, and alternatives regarding colonoscopy have been reviewed with the patient.  Questions have been answered.  All parties agreeable.

## 2018-06-24 NOTE — Transfer of Care (Signed)
Immediate Anesthesia Transfer of Care Note  Patient: Leslie Barnett  Procedure(s) Performed: COLONOSCOPY WITH PROPOFOL (N/A )  Patient Location: PACU  Anesthesia Type: General  Level of Consciousness: awake, alert  and patient cooperative  Airway and Oxygen Therapy: Patient Spontanous Breathing and Patient connected to supplemental oxygen  Post-op Assessment: Post-op Vital signs reviewed, Patient's Cardiovascular Status Stable, Respiratory Function Stable, Patent Airway and No signs of Nausea or vomiting  Post-op Vital Signs: Reviewed and stable  Complications: No apparent anesthesia complications

## 2018-06-24 NOTE — Op Note (Signed)
[facility] Gastroenterology Patient Name: Leslie Barnett Procedure Date: 06/24/2018 10:00 AM MRN: [id number] Account #: [id number] Date of Birth: 09/18/1966 Admit Type: Outpatient Age: 52 Room: [facility] OR ROOM 01 Gender: Female Note Status: Finalized Procedure:            Colonoscopy Indications:          Screening for colorectal malignant neoplasm Providers:            Midge Minium MD, MD Referring MD:         Leim Fabry MD, MD (Referring MD) Medicines:            Propofol per Anesthesia Complications:        No immediate complications. Procedure:            Pre-Anesthesia Assessment:                       - Prior to the procedure, a History and Physical was                        performed, and patient medications and allergies were                        reviewed. The patient's tolerance of previous                        anesthesia was also reviewed. The risks and benefits of                        the procedure and the sedation options and risks were                        discussed with the patient. All questions were                        answered, and informed consent was obtained. Prior                        Anticoagulants: The patient has taken no previous                        anticoagulant or antiplatelet agents. ASA Grade                        Assessment: II - A patient with mild systemic disease.                        After reviewing the risks and benefits, the patient was                        deemed in satisfactory condition to undergo the                        procedure.                       After obtaining informed consent, the colonoscope was                        passed under direct vision. Throughout the procedure,  the patient's blood pressure, pulse, and oxygen                        saturations were monitored continuously. The was                        introduced through the anus and advanced to the the                    cecum, identified by appendiceal orifice and ileocecal                        valve. The colonoscopy was performed without                        difficulty. The patient tolerated the procedure well.                        The quality of the bowel preparation was excellent. Findings:      The perianal and digital rectal examinations were normal.      The colon (entire examined portion) appeared normal. Impression:           - The entire examined colon is normal.                       - No specimens collected. Recommendation:       - Discharge patient to home.                       - Resume previous diet.                       - Continue present medications.                       - Repeat colonoscopy in 10 years for screening unless                        any change in family history or lower GI problems. Procedure Code(s):    --- Professional ---                       [phone], Colonoscopy, flexible; diagnostic, including                        collection of specimen(s) by brushing or washing, when                        performed (separate procedure) Diagnosis Code(s):    --- Professional ---                       Z12.11, Encounter for screening for malignant neoplasm                        of colon CPT copyright 2017 American Medical Association. All rights reserved. The codes documented in this report are preliminary and upon coder review may  be revised to meet current compliance requirements. Midge Miniumarren Parish Dubose MD, MD 06/24/2018 10:24:05 AM This report has been signed electronically. Number of Addenda: 0 Note Initiated On: 06/24/2018 10:00 AM Scope Withdrawal Time: 0 hours 8 minutes 2 seconds  Total Procedure  Duration: 0 hours 16 minutes 8 seconds       [facility]

## 2018-06-24 NOTE — Anesthesia Postprocedure Evaluation (Signed)
Anesthesia Post Note  Patient: Leslie Barnett  Procedure(s) Performed: COLONOSCOPY WITH PROPOFOL (N/A )  Patient location during evaluation: PACU Anesthesia Type: General Level of consciousness: awake and alert Pain management: pain level controlled Vital Signs Assessment: post-procedure vital signs reviewed and stable Respiratory status: spontaneous breathing, nonlabored ventilation, respiratory function stable and patient connected to nasal cannula oxygen Cardiovascular status: blood pressure returned to baseline and stable Postop Assessment: no apparent nausea or vomiting Anesthetic complications: no    Alta CorningBacon, Gonsalo Cuthbertson S

## 2018-06-24 NOTE — Anesthesia Procedure Notes (Signed)
Procedure Name: MAC Date/Time: 06/24/2018 10:01 AM Performed by: Janna Arch, CRNA Pre-anesthesia Checklist: Patient identified, Emergency Drugs available, Suction available and Patient being monitored Patient Re-evaluated:Patient Re-evaluated prior to induction Oxygen Delivery Method: Nasal cannula

## 2018-07-08 ENCOUNTER — Ambulatory Visit
Admission: RE | Admit: 2018-07-08 | Discharge: 2018-07-08 | Disposition: A | Discharge status: Home / Self Care | Payer: BLUE CROSS/BLUE SHIELD | Source: Ambulatory Visit | Attending: Obstetrics and Gynecology | Admitting: Obstetrics and Gynecology

## 2018-07-08 DIAGNOSIS — N6320 Unspecified lump in the left breast, unspecified quadrant: Secondary | ICD-10-CM

## 2018-07-08 DIAGNOSIS — R928 Other abnormal and inconclusive findings on diagnostic imaging of breast: Secondary | ICD-10-CM

## 2018-07-08 HISTORY — PX: BREAST BIOPSY: SHX20

## 2018-07-11 LAB — SURGICAL PATHOLOGY

## 2018-10-20 ENCOUNTER — Ambulatory Visit
Admission: EM | Admit: 2018-10-20 | Discharge: 2018-10-20 | Disposition: A | Discharge status: Home / Self Care | Payer: BLUE CROSS/BLUE SHIELD | Attending: Family Medicine | Admitting: Family Medicine

## 2018-10-20 ENCOUNTER — Other Ambulatory Visit: Payer: Self-pay

## 2018-10-20 ENCOUNTER — Encounter: Payer: Self-pay | Admitting: Emergency Medicine

## 2018-10-20 DIAGNOSIS — B9789 Other viral agents as the cause of diseases classified elsewhere: Secondary | ICD-10-CM | POA: Diagnosis not present

## 2018-10-20 DIAGNOSIS — J069 Acute upper respiratory infection, unspecified: Secondary | ICD-10-CM

## 2018-10-20 LAB — RAPID INFLUENZA A&B ANTIGENS (ARMC ONLY)
INFLUENZA A (ARMC): NEGATIVE
INFLUENZA B (ARMC): NEGATIVE

## 2018-10-20 NOTE — ED Provider Notes (Signed)
[facility]    CSN: [id number] Arrival date & time: 10/20/18  1206     History   Chief Complaint Chief Complaint  Patient presents with  . Cough    HPI Leslie Barnett is a 52 y.o. female.   The history is provided by the patient.  URI  Presenting symptoms: congestion, cough, fever and sore throat   Severity:  Moderate Onset quality:  Sudden Duration:  2 days Timing:  Constant Progression:  Worsening Chronicity:  New Relieved by:  OTC medications Associated symptoms: no wheezing   Risk factors: sick contacts   Risk factors: not elderly, no chronic cardiac disease, no chronic kidney disease, no chronic respiratory disease and no diabetes mellitus     Past Medical History:  Diagnosis Date  . Anxiety   . Asthma   . Family history of adverse reaction to anesthesia    Mother - PONV  . GERD (gastroesophageal reflux disease)   . Heart palpitations   . Hypertension   . Motion sickness    back seat cars  . NSVT (nonsustained ventricular tachycardia) [facility](HCC)     Patient Active Problem List   Diagnosis Date Noted  . Encounter for screening colonoscopy     Past Surgical History:  Procedure Laterality Date  . ANKLE ARTHROSCOPY Left 04/18/2018  . BREAST BIOPSY Left 07/08/2018   pending path  . CERVICAL FUSION     C5-6-7  . CHOLECYSTECTOMY    . COLONOSCOPY WITH PROPOFOL N/A 06/24/2018   Procedure: COLONOSCOPY WITH PROPOFOL;  Surgeon: Midge MiniumWohl, Darren, MD;  [facility];  Service: Endoscopy;  Laterality: N/A;  . GASTRIC BYPASS      OB History    Gravida  2   Para  2   Term  2   Preterm      AB      Living  2     SAB      TAB      Ectopic      Multiple      Live Births  2            Home Medications    Prior to Admission medications   Medication Sig Start Date End Date Taking? Authorizing Provider  albuterol (PROVENTIL HFA;VENTOLIN HFA) 108 (90 Base) MCG/ACT inhaler Inhale 1-2 puffs into the lungs every 6 (six)  hours as needed for wheezing or shortness of breath.   Yes [provider]  cholecalciferol (VITAMIN D) 1000 units tablet Take 2,000 Units by mouth daily.   Yes [provider]  DULoxetine (CYMBALTA) 60 MG capsule Take 60 mg by mouth daily.   Yes [provider]  Fluticasone-Salmeterol (ADVAIR) 250-50 MCG/DOSE AEPB Inhale 1 puff into the lungs 2 (two) times daily.   Yes [provider]  LORazepam (ATIVAN) 0.5 MG tablet Take 0.5 mg by mouth every 4 (four) hours as needed for anxiety.   Yes [provider]  metoprolol succinate (TOPROL-XL) 25 MG 24 hr tablet Take 12.5 mg by mouth daily.   Yes [provider]  Multiple Vitamin (MULTIVITAMIN) tablet Take 1 tablet by mouth daily.   Yes [provider]  ranitidine (ZANTAC) 150 MG capsule Take 150 mg by mouth 2 (two) times daily.   Yes [provider]  vitamin B-12 (CYANOCOBALAMIN) 1000 MCG tablet Take 1,000 mcg by mouth daily.   Yes [provider]    Family History Family History  Problem Relation Age of Onset  . Breast cancer  Mother 1877  . Breast cancer Paternal Grandmother 5760    Social History Social History   Tobacco Use  . Smoking status: Never Smoker  . Smokeless tobacco: Never Used  Substance Use Topics  . Alcohol use: No  . Drug use: No     Allergies   Patient has no known allergies.   Review of Systems Review of Systems  Constitutional: Positive for fever.  HENT: Positive for congestion and sore throat.   Respiratory: Positive for cough. Negative for wheezing.      Physical Exam Triage Vital Signs ED Triage Vitals  Enc Vitals Group     BP 10/20/18 1232 134/82     Pulse Rate 10/20/18 1232 86     Resp 10/20/18 1232 18     Temp 10/20/18 1232 100.3 F (37.9 C)     Temp src --      SpO2 10/20/18 1232 98 %     Weight 10/20/18 1229 292 lb (132.5 kg)     Height 10/20/18 1229 5\' 9"  (1.753 m)     Head Circumference --      Peak Flow --       Pain Score 10/20/18 1228 4     Pain Loc --      Pain Edu? --      Excl. in GC? --    No data found.  Updated Vital Signs BP 134/82 (BP Location: Left Arm)   Pulse 86   Temp 100.3 F (37.9 C)   Resp 18   Ht 5\' 9"  (1.753 m)   Wt 132.5 kg   SpO2 98%   BMI 43.12 kg/m   Visual Acuity Right Eye Distance:   Left Eye Distance:   Bilateral Distance:    Right Eye Near:   Left Eye Near:    Bilateral Near:     Physical Exam Vitals signs and nursing note reviewed.  Constitutional:      General: She is not in acute distress.    Appearance: She is well-developed. She is not toxic-appearing or diaphoretic.  HENT:     Head: Normocephalic and atraumatic.     Right Ear: Tympanic membrane, ear canal and external ear normal.     Left Ear: Tympanic membrane, ear canal and external ear normal.     Nose: Mucosal edema and rhinorrhea present. No nasal deformity, septal deviation or laceration.     Right Sinus: Maxillary sinus tenderness and frontal sinus tenderness present.     Left Sinus: Maxillary sinus tenderness and frontal sinus tenderness present.     Mouth/Throat:     Pharynx: Uvula midline. No oropharyngeal exudate.  Eyes:     General: No scleral icterus.       Right eye: No discharge.        Left eye: No discharge.     Conjunctiva/sclera: Conjunctivae normal.  Neck:     Musculoskeletal: Normal range of motion and neck supple.     Thyroid: No thyromegaly.  Cardiovascular:     Rate and Rhythm: Normal rate and regular rhythm.     Heart sounds: Normal heart sounds.  Pulmonary:     Effort: Pulmonary effort is normal. No respiratory distress.     Breath sounds: Normal breath sounds. No stridor. No wheezing, rhonchi or rales.  Lymphadenopathy:     Cervical: No cervical adenopathy.  Neurological:     Mental Status: She is alert.      UC Treatments / Results  Labs (all labs ordered are listed,  but only abnormal results are displayed) Labs Reviewed  RAPID INFLUENZA A&B  ANTIGENS ([facility] ONLY)    EKG None  Radiology No results found.  Procedures Procedures (including critical care time)  Medications Ordered in UC Medications - No data to display  Initial Impression / Assessment and Plan / UC Course  I have reviewed the triage vital signs and the nursing notes.  Pertinent labs & imaging results that were available during my care of the patient were reviewed by me and considered in my medical decision making (see chart for details).      Final Clinical Impressions(s) / UC Diagnoses   Final diagnoses:  Viral URI with cough     Discharge Instructions     Rest, fluids    ED Prescriptions    None      1. Lab results and diagnosis reviewed with patient 2. Recommend supportive treatment with rest, fluids, otc meds prn  3. Follow-up prn if symptoms worsen or don't improve   Controlled Substance Prescriptions [place] Controlled Substance Registry consulted? Not Applicable   Payton Mccallum, MD 10/20/18 1321

## 2018-10-20 NOTE — ED Triage Notes (Signed)
Patient states she developed a cough, sore throat  And fever last night

## 2018-10-20 NOTE — Discharge Instructions (Signed)
Rest, fluids. 

## 2019-06-28 ENCOUNTER — Telehealth: Payer: Self-pay

## 2019-06-28 NOTE — Telephone Encounter (Signed)
Patient states she has apt w/SDJ for AE, but she needs to get a referral/orders for mammogram at [facility]). Cb#[phone]

## 2019-06-29 ENCOUNTER — Other Ambulatory Visit: Payer: Self-pay | Admitting: Obstetrics and Gynecology

## 2019-06-29 DIAGNOSIS — Z1239 Encounter for other screening for malignant neoplasm of breast: Secondary | ICD-10-CM

## 2019-06-29 NOTE — Telephone Encounter (Signed)
Screening mammogram ordered

## 2019-06-29 NOTE — Telephone Encounter (Signed)
Pt aware via vm 

## 2019-07-12 ENCOUNTER — Ambulatory Visit: Payer: BLUE CROSS/BLUE SHIELD | Admitting: Obstetrics and Gynecology

## 2019-07-13 ENCOUNTER — Ambulatory Visit: Payer: BC Managed Care – PPO | Admitting: Obstetrics and Gynecology

## 2019-07-19 ENCOUNTER — Ambulatory Visit (INDEPENDENT_AMBULATORY_CARE_PROVIDER_SITE_OTHER): Payer: BC Managed Care – PPO | Admitting: Obstetrics and Gynecology

## 2019-07-19 ENCOUNTER — Encounter: Payer: Self-pay | Admitting: Obstetrics and Gynecology

## 2019-07-19 ENCOUNTER — Other Ambulatory Visit: Payer: Self-pay

## 2019-07-19 VITALS — BP 126/79 | HR 72 | Ht 69.0 in | Wt 302.0 lb

## 2019-07-19 DIAGNOSIS — Z1339 Encounter for screening examination for other mental health and behavioral disorders: Secondary | ICD-10-CM

## 2019-07-19 DIAGNOSIS — Z23 Encounter for immunization: Secondary | ICD-10-CM

## 2019-07-19 DIAGNOSIS — Z01419 Encounter for gynecological examination (general) (routine) without abnormal findings: Secondary | ICD-10-CM | POA: Diagnosis not present

## 2019-07-19 DIAGNOSIS — Z1331 Encounter for screening for depression: Secondary | ICD-10-CM

## 2019-07-19 NOTE — Progress Notes (Signed)
Routine Annual Gynecology Examination   PCP: Leim Fabry, MD  Chief Complaint  Patient presents with  . Gynecologic Exam    Flu shot given   History of Present Illness: Patient is a 53 y.o. G2P2002 presents for annual exam. The patient has no complaints today.   Menopausal bleeding: denies, last period 2015  Menopausal symptoms: denies  Breast symptoms: denies  Last pap smear: 1 years ago.  Result Normal HPV negative  Last mammogram: 1 years ago.  Result Normal. She did require a biopsy, but this returned as apocrine microcysts, negative for atypia and malignancy.  Recommended follow up was 1 year for routine. She is scheduled for mammogram on 08/09/2019.   Last Colonoscopy: 06/24/2018 - no abnormalities.  Follow up in 10 years  Past Medical History:  Diagnosis Date  . Anxiety   . Asthma   . Family history of adverse reaction to anesthesia    Mother - PONV  . GERD (gastroesophageal reflux disease)   . Heart palpitations   . Hypertension   . Motion sickness    back seat cars  . NSVT (nonsustained ventricular tachycardia) (HCC)     Past Surgical History:  Procedure Laterality Date  . ANKLE ARTHROSCOPY Left 04/18/2018  . BREAST BIOPSY Left 07/08/2018   pending path  . CERVICAL FUSION     C5-6-7  . CHOLECYSTECTOMY    . COLONOSCOPY WITH PROPOFOL N/A 06/24/2018   Procedure: COLONOSCOPY WITH PROPOFOL;  Surgeon: Midge Minium, MD;  Location: [facility];  Service: Endoscopy;  Laterality: N/A;  . GASTRIC BYPASS      Prior to Admission medications   Medication Sig Start Date End Date Taking? Authorizing Provider  albuterol (PROVENTIL HFA;VENTOLIN HFA) 108 (90 Base) MCG/ACT inhaler Inhale 1-2 puffs into the lungs every 6 (six) hours as needed for wheezing or shortness of breath.   Yes [provider]  cholecalciferol (VITAMIN D) 1000 units tablet Take 2,000 Units by mouth daily.   Yes [provider]  DULoxetine (CYMBALTA) 60 MG capsule  Take 60 mg by mouth daily.   Yes [provider]  Fluticasone-Salmeterol (ADVAIR) 250-50 MCG/DOSE AEPB Inhale 1 puff into the lungs 2 (two) times daily.   Yes [provider]  LORazepam (ATIVAN) 0.5 MG tablet Take 0.5 mg by mouth every 4 (four) hours as needed for anxiety.   Yes [provider]  metoprolol succinate (TOPROL-XL) 25 MG 24 hr tablet Take 12.5 mg by mouth daily.   Yes [provider]  Multiple Vitamin (MULTIVITAMIN) tablet Take 1 tablet by mouth daily.   Yes [provider]  vitamin B-12 (CYANOCOBALAMIN) 1000 MCG tablet Take 1,000 mcg by mouth daily.   Yes [provider]   Allergies: No Known Allergies   Obstetric History: [id number]  Social History   Socioeconomic History  . Marital status: Married    Spouse name: Not on file  . Number of children: Not on file  . Years of education: Not on file  . Highest education level: Not on file  Occupational History  . Not on file  Social Needs  . Financial resource strain: Not on file  . Food insecurity    Worry: Not on file    Inability: Not on file  . Transportation needs    Medical: Not on file    Non-medical: Not on file  Tobacco Use  . Smoking status: Never Smoker  . Smokeless tobacco: Never Used  Substance and Sexual Activity  . Alcohol use:  No  . Drug use: No  . Sexual activity: Yes    Birth control/protection: None  Lifestyle  . Physical activity    Days per week: Not on file    Minutes per session: Not on file  . Stress: Not on file  Relationships  . Social Musician on phone: Not on file    Gets together: Not on file    Attends religious service: Not on file    Active member of club or organization: Not on file    Attends meetings of clubs or organizations: Not on file    Relationship status: Not on file  . Intimate partner violence    Fear of current or ex partner: Not on file    Emotionally abused: Not on file    Physically abused: Not  on file    Forced sexual activity: Not on file  Other Topics Concern  . Not on file  Social History Narrative  . Not on file    Family History  Problem Relation Age of Onset  . Breast cancer Mother 67  . Breast cancer Paternal Grandmother 54    Review of Systems  Constitutional: Negative.   HENT: Negative.   Eyes: Negative.   Respiratory: Negative.   Cardiovascular: Negative.   Gastrointestinal: Negative.   Genitourinary: Negative.   Musculoskeletal: Negative.   Skin: Negative.   Neurological: Negative.   Psychiatric/Behavioral: Negative.      Physical Exam Vitals: BP 126/79 (BP Location: Left Arm, Patient Position: Sitting, Cuff Size: Large)   Pulse 72   Ht 5\' 9"  (1.753 m)   Wt (!) 302 lb (137 kg)   BMI 44.60 kg/m   Physical Exam Constitutional:      General: She is not in acute distress.    Appearance: Normal appearance. She is well-developed.  Genitourinary:     Pelvic exam was performed with patient in the lithotomy position.     Vulva, urethra, bladder and uterus normal.     No inguinal adenopathy present in the right or left side.    No signs of injury in the vagina.     No vaginal discharge, erythema, tenderness or bleeding.     No cervical motion tenderness, discharge, lesion or polyp.     Uterus is mobile.     Uterus is not enlarged or tender.     No uterine mass detected.    Uterus is anteverted.     No right or left adnexal mass present.     Right adnexa not tender or full.     Left adnexa not tender or full.     Genitourinary Comments: Bimanual limited by body habitus  HENT:     Head: Normocephalic and atraumatic.  Eyes:     General: No scleral icterus.    Conjunctiva/sclera: Conjunctivae normal.  Neck:     Musculoskeletal: Normal range of motion and neck supple.     Thyroid: No thyromegaly.  Cardiovascular:     Rate and Rhythm: Normal rate and regular rhythm.     Heart sounds: No murmur. No friction rub. No gallop.   Pulmonary:      Effort: Pulmonary effort is normal. No respiratory distress.     Breath sounds: Normal breath sounds. No wheezing or rales.  Chest:     Breasts:        Right: No inverted nipple, mass, nipple discharge, skin change or tenderness.        Left: No inverted nipple,  mass, nipple discharge, skin change or tenderness.  Abdominal:     General: Bowel sounds are normal. There is no distension.     Palpations: Abdomen is soft. There is no mass.     Tenderness: There is no abdominal tenderness. There is no guarding or rebound.  Musculoskeletal: Normal range of motion.        General: No swelling or tenderness.  Lymphadenopathy:     Cervical: No cervical adenopathy.     Lower Body: No right inguinal adenopathy. No left inguinal adenopathy.  Neurological:     General: No focal deficit present.     Mental Status: She is alert and oriented to person, place, and time.     Cranial Nerves: No cranial nerve deficit.  Skin:    General: Skin is warm and dry.     Findings: No erythema or rash.  Psychiatric:        Mood and Affect: Mood normal.        Behavior: Behavior normal.        Judgment: Judgment normal.  Vitals signs reviewed. Exam conducted with a chaperone present.      Female chaperone present for pelvic and breast  portions of the physical exam  Results: AUDIT Questionnaire (screen for alcoholism): 0 PHQ-9: 0   Assessment and Plan:  53 y.o. G42P2002 female here for routine annual gynecologic examination  Plan: Problem List Items Addressed This Visit    None    Visit Diagnoses    Women's annual routine gynecological examination    -  Primary   Flu vaccine need       Relevant Orders   Flu Vaccine QUAD 36+ mos IM (Completed)   Screening for depression       Screening for alcoholism          Screening: -- Blood pressure screen managed by PCP -- Colonoscopy - not due -- Mammogram - due - already scheduled at [facility] on 08/09/2019 -- Weight screening: obese: discussed  management options, including lifestyle, dietary, and exercise. -- Depression screening negative (PHQ-9) -- Nutrition: normal -- cholesterol screening: per PCP -- osteoporosis screening: not due -- tobacco screening: not using -- alcohol screening: AUDIT questionnaire indicates low-risk usage. -- family history of breast cancer screening: done. not at high risk. -- no evidence of domestic violence or intimate partner violence. -- STD screening: gonorrhea/chlamydia NAAT not collected per patient request. -- pap smear not collected per ASCCP guidelines -- flu vaccine received at today's visit -- HPV vaccination series: not eligilbe  Prentice Docker, MD 07/19/2019 11:09 AM

## 2019-08-09 ENCOUNTER — Ambulatory Visit
Admission: RE | Admit: 2019-08-09 | Discharge: 2019-08-09 | Disposition: A | Discharge status: Home / Self Care | Payer: BC Managed Care – PPO | Source: Ambulatory Visit | Attending: Obstetrics and Gynecology | Admitting: Obstetrics and Gynecology

## 2019-08-09 DIAGNOSIS — Z1231 Encounter for screening mammogram for malignant neoplasm of breast: Secondary | ICD-10-CM | POA: Diagnosis not present

## 2019-08-09 DIAGNOSIS — Z1239 Encounter for other screening for malignant neoplasm of breast: Secondary | ICD-10-CM

## 2020-08-28 ENCOUNTER — Encounter: Payer: Self-pay | Admitting: Obstetrics and Gynecology

## 2020-08-28 ENCOUNTER — Ambulatory Visit (INDEPENDENT_AMBULATORY_CARE_PROVIDER_SITE_OTHER): Payer: BC Managed Care – PPO | Admitting: Obstetrics and Gynecology

## 2020-08-28 ENCOUNTER — Other Ambulatory Visit: Payer: Self-pay

## 2020-08-28 VITALS — BP 136/83 | HR 70 | Resp 18 | Ht 69.0 in | Wt 288.0 lb

## 2020-08-28 DIAGNOSIS — Z1331 Encounter for screening for depression: Secondary | ICD-10-CM | POA: Diagnosis not present

## 2020-08-28 DIAGNOSIS — Z23 Encounter for immunization: Secondary | ICD-10-CM | POA: Diagnosis not present

## 2020-08-28 DIAGNOSIS — Z1339 Encounter for screening examination for other mental health and behavioral disorders: Secondary | ICD-10-CM | POA: Diagnosis not present

## 2020-08-28 DIAGNOSIS — Z01419 Encounter for gynecological examination (general) (routine) without abnormal findings: Secondary | ICD-10-CM | POA: Diagnosis not present

## 2020-08-28 NOTE — Progress Notes (Signed)
Routine Annual Gynecology Examination   PCP: Leim Fabry, MD  Chief Complaint  Patient presents with  . Gynecologic Exam   History of Present Illness: Patient is a 54 y.o. [id number] presents for annual exam. The patient has no complaints today.   Menopausal bleeding: denies  Menopausal symptoms: denies  Breast symptoms: denies  Last pap smear: 04/2018 years ago.  Result Normal, HPV negative  Last mammogram: 1 year ago.  Result Normal   Last colonoscopy: 2 years ago. Normal with 10 year follow up.   Past Medical History:  Diagnosis Date  . Anxiety   . Asthma   . Family history of adverse reaction to anesthesia    Mother - PONV  . GERD (gastroesophageal reflux disease)   . Heart palpitations   . Hypertension   . Motion sickness    back seat cars  . NSVT (nonsustained ventricular tachycardia) (HCC)     Past Surgical History:  Procedure Laterality Date  . ANKLE ARTHROSCOPY Left 04/18/2018  . BREAST BIOPSY Left 07/08/2018   neg for atypia  . CERVICAL FUSION     C5-6-7  . CHOLECYSTECTOMY    . COLONOSCOPY WITH PROPOFOL N/A 06/24/2018   Procedure: COLONOSCOPY WITH PROPOFOL;  Surgeon: Midge Minium, MD;  Location: [facility];  Service: Endoscopy;  Laterality: N/A;  . GASTRIC BYPASS      Prior to Admission medications   Medication Sig Start Date End Date Taking? Authorizing Provider  albuterol (PROVENTIL HFA;VENTOLIN HFA) 108 (90 Base) MCG/ACT inhaler Inhale 1-2 puffs into the lungs every 6 (six) hours as needed for wheezing or shortness of breath.   Yes [provider]  cholecalciferol (VITAMIN D) 1000 units tablet Take 2,000 Units by mouth daily.   Yes [provider]  DULoxetine (CYMBALTA) 60 MG capsule Take 60 mg by mouth daily.   Yes [provider]  Fluticasone-Salmeterol (ADVAIR) 250-50 MCG/DOSE AEPB Inhale 1 puff into the lungs 2 (two) times daily.   Yes [provider]  LORazepam (ATIVAN) 0.5 MG tablet Take 0.5  mg by mouth every 4 (four) hours as needed for anxiety.   Yes [provider]  metoprolol succinate (TOPROL-XL) 25 MG 24 hr tablet Take 12.5 mg by mouth daily.   Yes [provider]  Multiple Vitamin (MULTIVITAMIN) tablet Take 1 tablet by mouth daily.   Yes [provider]  omeprazole (PRILOSEC) 20 MG capsule Take 20 mg by mouth daily.   Yes [provider]  vitamin B-12 (CYANOCOBALAMIN) 1000 MCG tablet Take 1,000 mcg by mouth daily.   Yes [provider]   Allergies: No Known Allergies  Obstetric History: [id number]  Social History   Socioeconomic History  . Marital status: Married    Spouse name: Not on file  . Number of children: Not on file  . Years of education: Not on file  . Highest education level: Not on file  Occupational History  . Not on file  Tobacco Use  . Smoking status: Never Smoker  . Smokeless tobacco: Never Used  Vaping Use  . Vaping Use: Never used  Substance and Sexual Activity  . Alcohol use: No  . Drug use: No  . Sexual activity: Yes    Birth control/protection: None  Other Topics Concern  . Not on file  Social History Narrative  . Not on file   Social Determinants of Health   Financial Resource Strain:   . Difficulty of Paying Living Expenses: Not on file  Food Insecurity:   .  Worried About Programme researcher, broadcasting/film/video in the Last Year: Not on file  . Ran Out of Food in the Last Year: Not on file  Transportation Needs:   . Lack of Transportation (Medical): Not on file  . Lack of Transportation (Non-Medical): Not on file  Physical Activity:   . Days of Exercise per Week: Not on file  . Minutes of Exercise per Session: Not on file  Stress:   . Feeling of Stress : Not on file  Social Connections:   . Frequency of Communication with Friends and Family: Not on file  . Frequency of Social Gatherings with Friends and Family: Not on file  . Attends Religious Services: Not on file  . Active Member of Clubs or  Organizations: Not on file  . Attends Banker Meetings: Not on file  . Marital Status: Not on file  Intimate Partner Violence:   . Fear of Current or Ex-Partner: Not on file  . Emotionally Abused: Not on file  . Physically Abused: Not on file  . Sexually Abused: Not on file    Family History  Problem Relation Age of Onset  . Breast cancer Mother 80  . Breast cancer Paternal Grandmother 39    Review of Systems  Constitutional: Negative.   HENT: Negative.   Eyes: Negative.   Respiratory: Negative.   Cardiovascular: Negative.   Gastrointestinal: Negative.   Genitourinary: Negative.   Musculoskeletal: Negative.   Skin: Negative.   Neurological: Negative.   Psychiatric/Behavioral: Negative.      Physical Exam Vitals: BP 136/83   Pulse 70   Resp 18   Ht 5\' 9"  (1.753 m)   Wt 288 lb (130.6 kg)   SpO2 98%   BMI 42.53 kg/m   Physical Exam Constitutional:      General: She is not in acute distress.    Appearance: Normal appearance. She is well-developed.  Genitourinary:     Pelvic exam was performed with patient in the lithotomy position.     Vulva, urethra, bladder and uterus normal.     No inguinal adenopathy present in the right or left side.    No signs of injury in the vagina.     No vaginal discharge, erythema, tenderness or bleeding.     No cervical motion tenderness, discharge, lesion or polyp.     Uterus is mobile.     Uterus is not enlarged or tender.     No uterine mass detected.    Uterus is anteverted.     No right or left adnexal mass present.     Right adnexa not tender or full.     Left adnexa not tender or full.  HENT:     Head: Normocephalic and atraumatic.  Eyes:     General: No scleral icterus.    Conjunctiva/sclera: Conjunctivae normal.  Neck:     Thyroid: No thyromegaly.  Cardiovascular:     Rate and Rhythm: Normal rate and regular rhythm.     Heart sounds: No murmur heard.  No friction rub. No gallop.   Pulmonary:      Effort: Pulmonary effort is normal. No respiratory distress.     Breath sounds: Normal breath sounds. No wheezing or rales.  Chest:     Breasts:        Right: No inverted nipple, mass, nipple discharge, skin change or tenderness.        Left: No inverted nipple, mass, nipple discharge, skin change or tenderness.  Abdominal:  General: Bowel sounds are normal. There is no distension.     Palpations: Abdomen is soft. There is no mass.     Tenderness: There is no abdominal tenderness. There is no guarding or rebound.  Musculoskeletal:        General: No swelling or tenderness. Normal range of motion.     Cervical back: Normal range of motion and neck supple.  Lymphadenopathy:     Cervical: No cervical adenopathy.     Lower Body: No right inguinal adenopathy. No left inguinal adenopathy.  Neurological:     General: No focal deficit present.     Mental Status: She is alert and oriented to person, place, and time.     Cranial Nerves: No cranial nerve deficit.  Skin:    General: Skin is warm and dry.     Findings: No erythema or rash.  Psychiatric:        Mood and Affect: Mood normal.        Behavior: Behavior normal.        Judgment: Judgment normal.      Female chaperone present for pelvic and breast  portions of the physical exam  Results: AUDIT Questionnaire (screen for alcoholism): 0 PHQ-9: 0  Assessment and Plan:  54 y.o. G28P2002 female here for routine annual gynecologic examination  Plan: Problem List Items Addressed This Visit    None    Visit Diagnoses    Women's annual routine gynecological examination    -  Primary   Screening for depression       Screening for alcoholism          Screening: -- Blood pressure screen managed by PCP -- Colonoscopy - not due -- Mammogram - due. Patient to call [place] to arrange. She understands that it is her responsibility to arrange this. -- Weight screening: overweight: continue to monitor -- Depression screening  negative (PHQ-9) -- Nutrition: normal -- cholesterol screening: per PCP -- osteoporosis screening: not due -- tobacco screening: not using -- alcohol screening: AUDIT questionnaire indicates low-risk usage. -- family history of breast cancer screening: done. not at high risk. -- no evidence of domestic violence or intimate partner violence. -- STD screening: gonorrhea/chlamydia NAAT not collected per patient request. -- pap smear not collected per ASCCP guidelines -- flu vaccine will receive -- HPV vaccination series: not eligilbe  -- COVID19 vaccines received x 2. Last in April   Thomasene Mohair, MD 08/28/2020 10:01 AM

## 2020-10-03 ENCOUNTER — Other Ambulatory Visit: Payer: Self-pay | Admitting: Obstetrics and Gynecology

## 2020-10-03 DIAGNOSIS — Z1231 Encounter for screening mammogram for malignant neoplasm of breast: Secondary | ICD-10-CM

## 2020-10-09 ENCOUNTER — Ambulatory Visit
Admission: RE | Admit: 2020-10-09 | Discharge: 2020-10-09 | Disposition: A | Discharge status: Home / Self Care | Payer: BC Managed Care – PPO | Source: Ambulatory Visit | Attending: Obstetrics and Gynecology | Admitting: Obstetrics and Gynecology

## 2020-10-09 ENCOUNTER — Other Ambulatory Visit: Payer: Self-pay

## 2020-10-09 DIAGNOSIS — Z1231 Encounter for screening mammogram for malignant neoplasm of breast: Secondary | ICD-10-CM | POA: Insufficient documentation

## 2021-04-09 DIAGNOSIS — Z981 Arthrodesis status: Secondary | ICD-10-CM | POA: Insufficient documentation

## 2021-04-09 DIAGNOSIS — K219 Gastro-esophageal reflux disease without esophagitis: Secondary | ICD-10-CM | POA: Insufficient documentation

## 2021-04-09 DIAGNOSIS — M5030 Other cervical disc degeneration, unspecified cervical region: Secondary | ICD-10-CM | POA: Insufficient documentation

## 2021-06-04 ENCOUNTER — Ambulatory Visit (INDEPENDENT_AMBULATORY_CARE_PROVIDER_SITE_OTHER): Payer: BC Managed Care – PPO | Admitting: Gastroenterology

## 2021-06-04 ENCOUNTER — Other Ambulatory Visit: Payer: Self-pay

## 2021-06-04 ENCOUNTER — Encounter: Payer: Self-pay | Admitting: Gastroenterology

## 2021-06-04 VITALS — BP 135/84 | HR 88 | Temp 97.4°F | Ht 69.0 in | Wt 306.4 lb

## 2021-06-04 DIAGNOSIS — K769 Liver disease, unspecified: Secondary | ICD-10-CM

## 2021-06-04 DIAGNOSIS — J45909 Unspecified asthma, uncomplicated: Secondary | ICD-10-CM | POA: Insufficient documentation

## 2021-06-04 DIAGNOSIS — K7689 Other specified diseases of liver: Secondary | ICD-10-CM | POA: Diagnosis not present

## 2021-06-04 DIAGNOSIS — I472 Ventricular tachycardia: Secondary | ICD-10-CM | POA: Insufficient documentation

## 2021-06-04 DIAGNOSIS — I4729 Other ventricular tachycardia: Secondary | ICD-10-CM | POA: Insufficient documentation

## 2021-06-04 DIAGNOSIS — F419 Anxiety disorder, unspecified: Secondary | ICD-10-CM | POA: Insufficient documentation

## 2021-06-04 NOTE — Progress Notes (Signed)
Gastroenterology Consultation  Referring Provider:     Leim Fabry, MD Primary Care Physician:  Leslie Fabry, MD Primary Gastroenterologist:  Dr. Servando Barnett     Reason for Consultation:     Liver cyst        HPI:   Leslie Barnett is a 55 y.o. y/o female referred for consultation & management of liver cyst by Dr. Leim Fabry, MD.  This patient comes to see me after having a colonoscopy in the past by me.  The patient had a MRI of her back and a liver lesion was noted.  The patient states that her MRI was through her orthopedics office and is not on the system.  The patient does, with a copy to reported her hand that shows it to note a lesion in the liver that could be a cyst versus a hemangioma but reported that it was likely benign in nature.  He also recommended a dedicated MRI of the liver.  The patient states that her father had stomach cancer and her mother had breast cancer so she was concerned about this finding.  There is no report of any unexplained weight loss fevers chills nausea vomiting black stools or bloody stools.  Past Medical History:  Diagnosis Date   Anxiety    Asthma    Family history of adverse reaction to anesthesia    Mother - PONV   GERD (gastroesophageal reflux disease)    Heart palpitations    Hypertension    Motion sickness    back seat cars   NSVT (nonsustained ventricular tachycardia) (HCC)     Past Surgical History:  Procedure Laterality Date   ANKLE ARTHROSCOPY Left 04/18/2018   BREAST BIOPSY Left 07/08/2018   neg for atypia   CERVICAL FUSION     C5-6-7   CHOLECYSTECTOMY     COLONOSCOPY WITH PROPOFOL N/A 06/24/2018   Procedure: COLONOSCOPY WITH PROPOFOL;  Surgeon: Leslie Minium, MD;  Location: [facility];  Service: Endoscopy;  Laterality: N/A;   GASTRIC BYPASS      Prior to Admission medications   Medication Sig Start Date End Date Taking? Authorizing Provider  albuterol (PROVENTIL HFA;VENTOLIN HFA) 108 (90 Base) MCG/ACT  inhaler Inhale 1-2 puffs into the lungs every 6 (six) hours as needed for wheezing or shortness of breath.    [provider]  cholecalciferol (VITAMIN D) 1000 units tablet Take 2,000 Units by mouth daily.    [provider]  DULoxetine (CYMBALTA) 60 MG capsule Take 60 mg by mouth daily.    [provider]  Fluticasone-Salmeterol (ADVAIR) 250-50 MCG/DOSE AEPB Inhale 1 puff into the lungs 2 (two) times daily.    [provider]  LORazepam (ATIVAN) 0.5 MG tablet Take 0.5 mg by mouth every 4 (four) hours as needed for anxiety.    [provider]  metoprolol succinate (TOPROL-XL) 25 MG 24 hr tablet Take 12.5 mg by mouth daily.    [provider]  Multiple Vitamin (MULTIVITAMIN) tablet Take 1 tablet by mouth daily.    [provider]  omeprazole (PRILOSEC) 20 MG capsule Take 20 mg by mouth daily.    [provider]  vitamin B-12 (CYANOCOBALAMIN) 1000 MCG tablet Take 1,000 mcg by mouth daily.    [provider]    Family History  Problem Relation Age of Onset   Breast cancer Mother 48   Breast cancer Paternal Grandmother 80     Social History   Tobacco Use   Smoking status:  Never   Smokeless tobacco: Never  Vaping Use   Vaping Use: Never used  Substance Use Topics   Alcohol use: No   Drug use: No    Allergies as of 06/04/2021   (No Known Allergies)    Review of Systems:    All systems reviewed and negative except where noted in HPI.   Physical Exam:  BP 135/84 (BP Location: Left Arm, Patient Position: Sitting, Cuff Size: Large)   Pulse 88   Temp (!) 97.4 F (36.3 C)   Ht 5\' 9"  (1.753 m)   Wt (!) 306 lb 6.4 oz (139 kg)   BMI 45.25 kg/m  No LMP recorded. Patient is postmenopausal. General:   Alert,  Well-developed, well-nourished, pleasant and cooperative in NAD Head:  Normocephalic and atraumatic. Eyes:  Sclera clear, no icterus.   Conjunctiva pink. Ears:  Normal auditory acuity. Neck:  Supple;  no masses or thyromegaly. Lungs:  Respirations even and unlabored.  Clear throughout to auscultation.   No wheezes, crackles, or rhonchi. No acute distress. Heart:  Regular rate and rhythm; no murmurs, clicks, rubs, or gallops. Abdomen:  Normal bowel sounds.  No bruits.  Soft, non-tender and non-distended without masses, hepatosplenomegaly or hernias noted.  No guarding or rebound tenderness.  Negative Carnett sign.   Rectal:  Deferred.  Pulses:  Normal pulses noted. Extremities:  No clubbing or edema.  No cyanosis. Neurologic:  Alert and oriented x3;  grossly normal neurologically. Skin:  Intact without significant lesions or rashes.  No jaundice. Lymph Nodes:  No significant cervical adenopathy. Psych:  Alert and cooperative. Normal mood and affect.  Imaging Studies: No results found.  Assessment and Plan:   Leslie Barnett is a 55 y.o. y/o female who comes in today with a incidental finding of a lesion on the liver.  The lesion was reported to be most likely benign cyst versus a hemangioma and a MRI with liver protocol was recommended.  The patient will be set up for the MRI with contrast to look and see if this lesion can be better delineated. The patient has been explained the plan and agrees with it and will be contacted with the results of the MRI when it is back.    53, MD. Leslie Barnett    Note: This dictation was prepared with Dragon dictation along with smaller phrase technology. Any transcriptional errors that result from this process are unintentional.

## 2021-06-14 ENCOUNTER — Other Ambulatory Visit: Payer: Self-pay

## 2021-06-14 ENCOUNTER — Ambulatory Visit
Admission: RE | Admit: 2021-06-14 | Discharge: 2021-06-14 | Disposition: A | Discharge status: Home / Self Care | Payer: BC Managed Care – PPO | Source: Ambulatory Visit | Attending: Gastroenterology | Admitting: Gastroenterology

## 2021-06-14 DIAGNOSIS — K769 Liver disease, unspecified: Secondary | ICD-10-CM | POA: Insufficient documentation

## 2021-06-14 MED ORDER — GADOBUTROL 1 MMOL/ML IV SOLN
10.0000 mL | Freq: Once | INTRAVENOUS | Status: AC | PRN
  Administered 2021-06-14: 10 mL via INTRAVENOUS

## 2021-06-19 ENCOUNTER — Telehealth: Payer: Self-pay

## 2021-06-19 NOTE — Telephone Encounter (Signed)
Pt notified of MRI results

## 2021-06-19 NOTE — Telephone Encounter (Signed)
-----   Message from Midge Minium, MD sent at 06/19/2021  8:35 AM EDT ----- Let the patient know that the MRI showed a 1.6 cm benign hemangioma which is a abnormal collection of blood vessels that have no malignant or premalignant features therefore need no further work-up or any further intervention.  It is completely benign.

## 2021-06-21 ENCOUNTER — Ambulatory Visit: Payer: BC Managed Care – PPO

## 2022-07-06 IMAGING — MG DIGITAL SCREENING BILAT W/ TOMO W/ CAD
6 of 10 series · 6 of 30 positions shown · non-contrast
Comparison: Previous exam(s).

CLINICAL DATA: Screening.

EXAM:
DIGITAL SCREENING BILATERAL MAMMOGRAM WITH TOMO AND CAD

[L CC synth-2D]
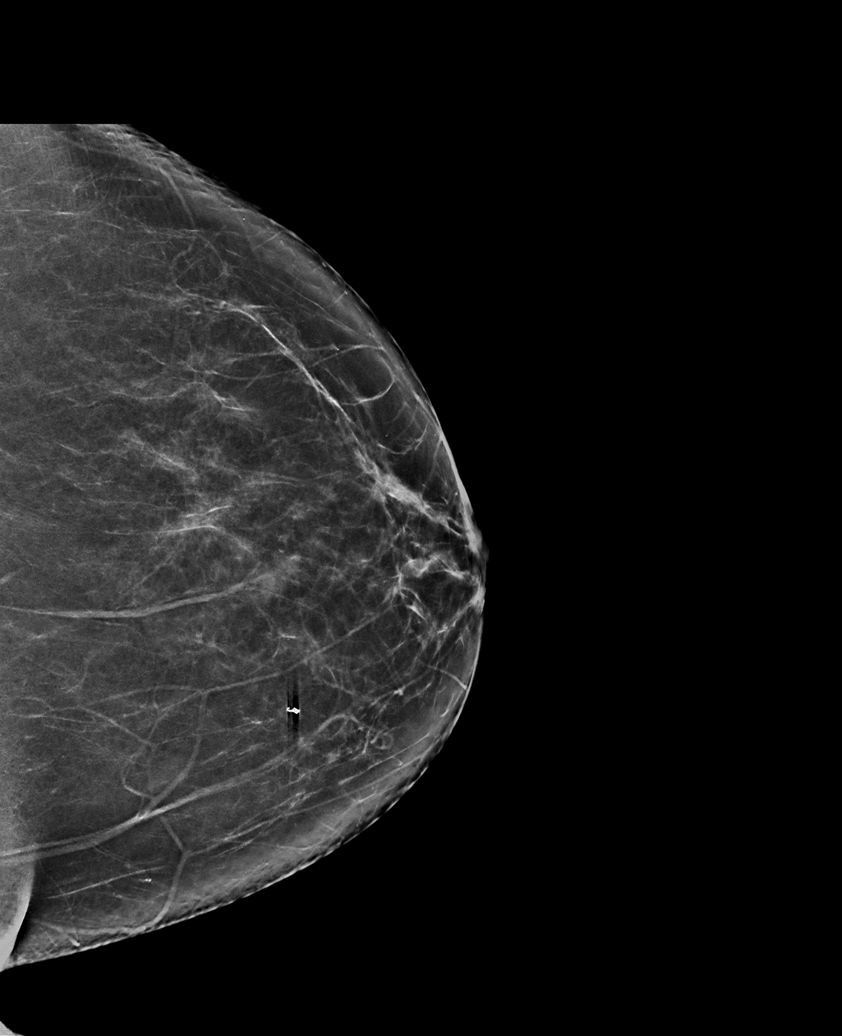

[L CV synth-2D]
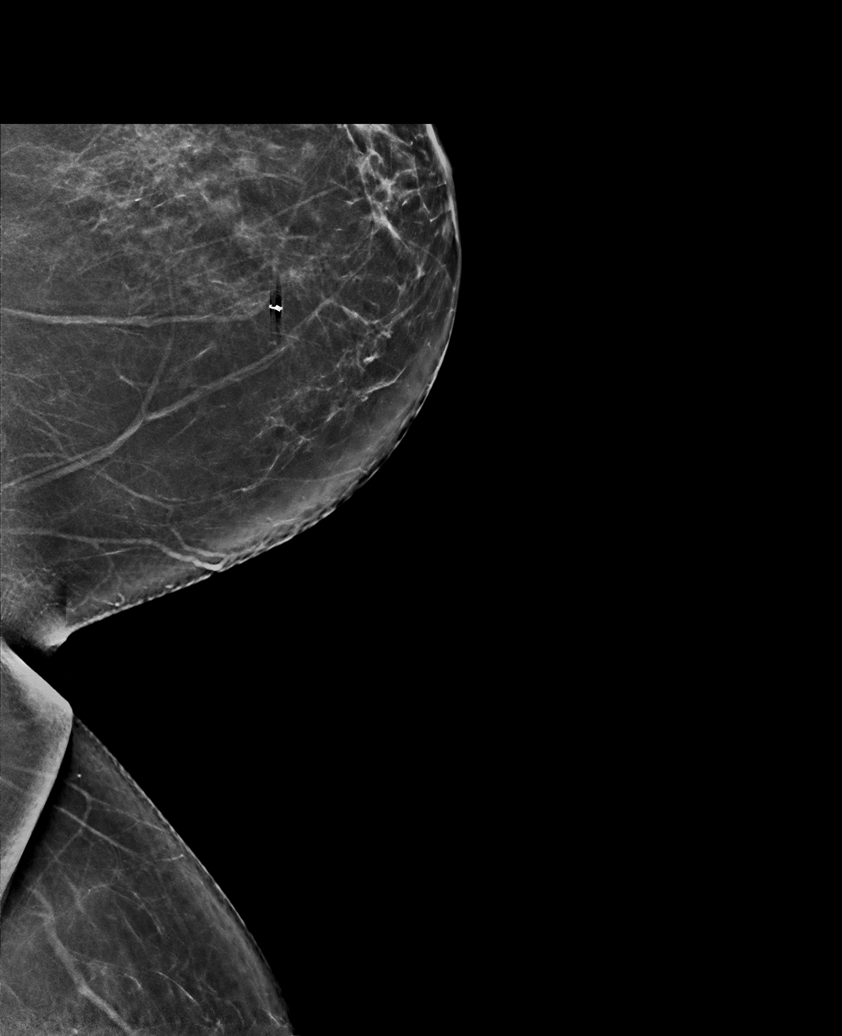

[R MLO synth-2D]
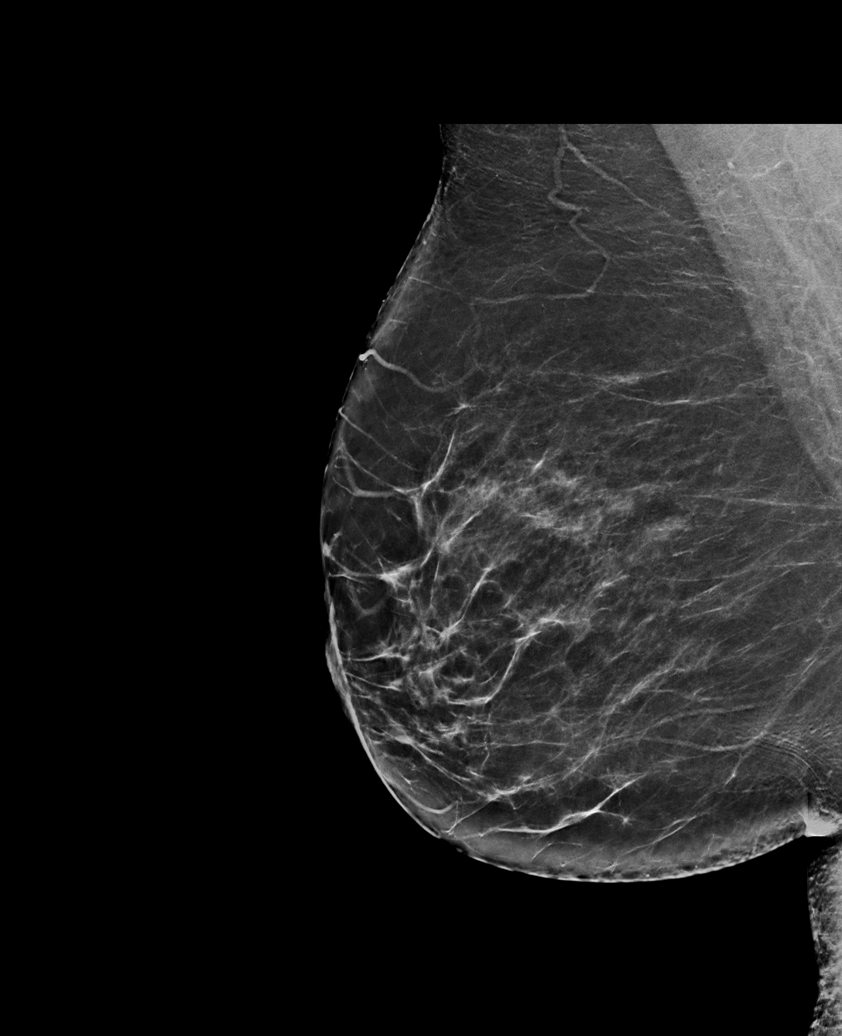

[R CC synth-2D]
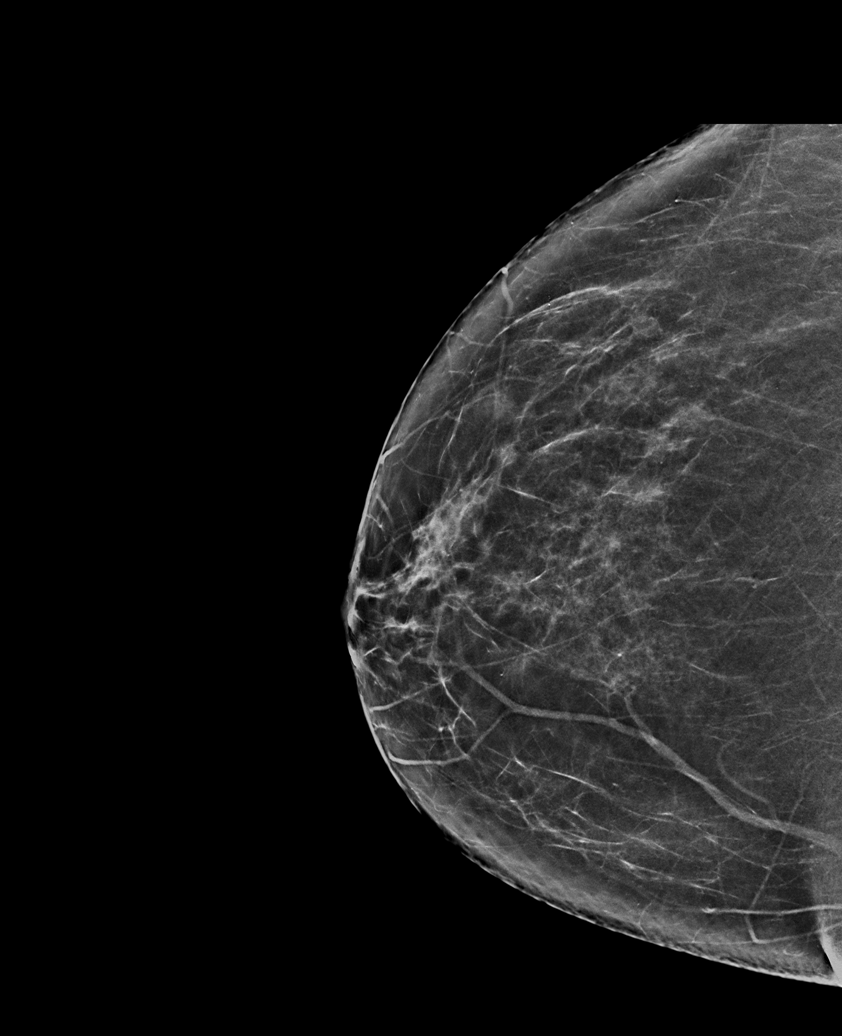

[L MLO synth-2D]
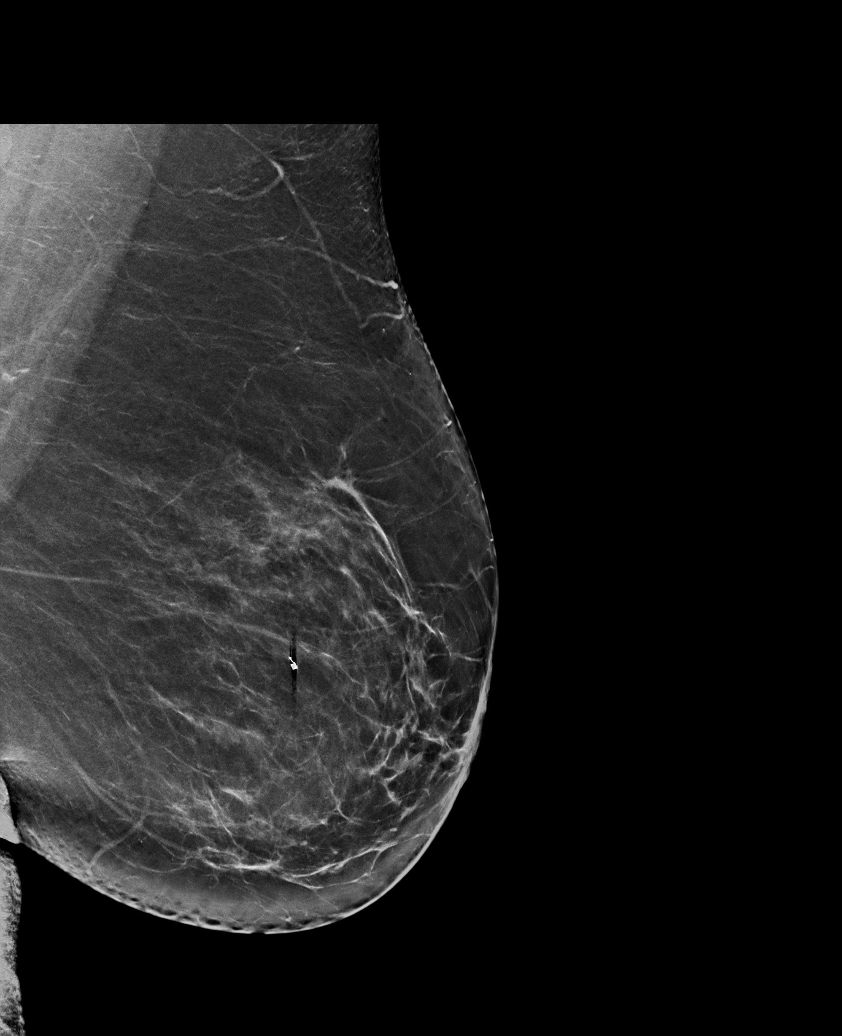

[R MLO tomo · tomo slice 43/84.0]
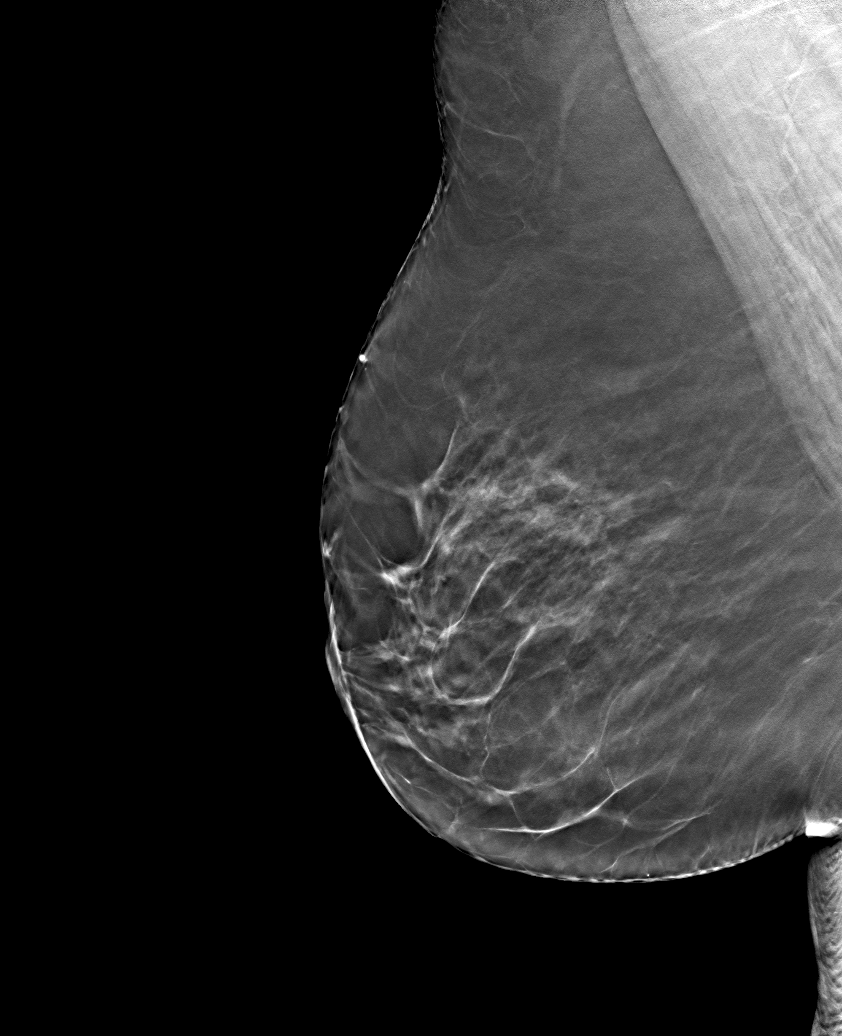

[6 of 30 positions shown; findings below may reference images not displayed]

ACR Breast Density Category b: There are scattered areas of
fibroglandular density.
FINDINGS: There are no findings suspicious for malignancy. Images were
processed with CAD.
IMPRESSION: No mammographic evidence of malignancy. A result letter of this
screening mammogram will be mailed directly to the patient.

RECOMMENDATION:
Screening mammogram in one year. (Code:CN-U-775)

BI-RADS CATEGORY  1: Negative.

## 2023-04-19 ENCOUNTER — Other Ambulatory Visit: Payer: Self-pay | Admitting: Obstetrics and Gynecology

## 2023-04-19 DIAGNOSIS — Z1231 Encounter for screening mammogram for malignant neoplasm of breast: Secondary | ICD-10-CM

## 2023-05-06 ENCOUNTER — Ambulatory Visit
Admission: RE | Admit: 2023-05-06 | Discharge: 2023-05-06 | Disposition: A | Discharge status: Home / Self Care | Payer: BC Managed Care – PPO | Source: Ambulatory Visit | Attending: Obstetrics and Gynecology | Admitting: Obstetrics and Gynecology

## 2023-05-06 DIAGNOSIS — Z1231 Encounter for screening mammogram for malignant neoplasm of breast: Secondary | ICD-10-CM | POA: Insufficient documentation

## 2024-03-11 ENCOUNTER — Ambulatory Visit
Admission: EM | Admit: 2024-03-11 | Discharge: 2024-03-11 | Disposition: A | Discharge status: Home / Self Care | Attending: Family Medicine | Admitting: Family Medicine

## 2024-03-11 ENCOUNTER — Encounter: Payer: Self-pay | Admitting: Emergency Medicine

## 2024-03-11 DIAGNOSIS — J069 Acute upper respiratory infection, unspecified: Secondary | ICD-10-CM | POA: Diagnosis present

## 2024-03-11 DIAGNOSIS — Z8709 Personal history of other diseases of the respiratory system: Secondary | ICD-10-CM | POA: Insufficient documentation

## 2024-03-11 LAB — SARS CORONAVIRUS 2 BY RT PCR: SARS Coronavirus 2 by RT PCR: NEGATIVE

## 2024-03-11 MED ORDER — IBUPROFEN 800 MG PO TABS
800.0000 mg | ORAL_TABLET | Freq: Once | ORAL | Status: AC
  Administered 2024-03-11: 800 mg via ORAL

## 2024-03-11 MED ORDER — ALBUTEROL SULFATE HFA 108 (90 BASE) MCG/ACT IN AERS: 2.0000 | INHALATION_SPRAY | RESPIRATORY_TRACT | 0 refills | Status: AC | PRN

## 2024-03-11 MED ORDER — IPRATROPIUM BROMIDE 0.06 % NA SOLN: 2.0000 | Freq: Four times a day (QID) | NASAL | 0 refills | Status: AC

## 2024-03-11 NOTE — ED Triage Notes (Signed)
 Pt c/o nasal congestion, headache, sinus pressure. Started a couple of days ago. She states she may have a little fever but hasn't checked it. Pt states she has to fly tomorrow. Pt declines covid/flu testing.

## 2024-03-11 NOTE — ED Provider Notes (Signed)
 [facility]    CSN: [id number] Arrival date & time: 03/11/24  0850      History   Chief Complaint Chief Complaint  Patient presents with   Nasal Congestion   Cough    HPI MAKENZE ELLETT is a 58 y.o. female.   HPI  History obtained from the patient. Journey presents for sinus pressure, headache, nasal congestion and swollen glands. Has slight cough.  Took some Tylenol  for her headache. No fever, sore throat, vomiting and diarrhea. 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 She doesn't want to fly with a sinus infection.       Past Medical History:  Diagnosis Date   Anxiety    Asthma    Family history of adverse reaction to anesthesia    Mother - PONV   GERD (gastroesophageal reflux disease)    Heart palpitations    Hypertension    Motion sickness    back seat cars   NSVT (nonsustained ventricular tachycardia) (HCC)     Patient Active Problem List   Diagnosis Date Noted   Anxiety 06/04/2021   Asthma without status asthmaticus 06/04/2021   NSVT (nonsustained ventricular tachycardia) (HCC) 06/04/2021   DDD (degenerative disc disease), cervical 04/09/2021   Gastroesophageal reflux disease 04/09/2021   History of arthrodesis 04/09/2021   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   Sprain of left ankle 03/02/2018   Heart palpitations 12/30/2016   Blood pressure elevated without history of HTN 09/25/2015   Chronic daily headache 09/25/2015   Neck pain 09/25/2015   Obesity 01/08/2013    Past Surgical History:  Procedure Laterality Date   ANKLE ARTHROSCOPY Left 04/18/2018   BREAST BIOPSY Left 07/08/2018   neg for atypia   CERVICAL FUSION     C5-6-7   CHOLECYSTECTOMY     COLONOSCOPY WITH PROPOFOL  N/A 06/24/2018   Procedure: COLONOSCOPY WITH PROPOFOL ;  Surgeon: Marnee Sink, MD;  Location: [facility];  Service: Endoscopy;  Laterality: N/A;   GASTRIC BYPASS      OB History     Gravida  2   Para  2   Term  2    Preterm      AB      Living  2      SAB      IAB      Ectopic      Multiple      Live Births  2            Home Medications    Prior to Admission medications   Medication Sig Start Date End Date Taking? Authorizing Provider  cholecalciferol (VITAMIN D) 1000 units tablet Take 2,000 Units by mouth daily.   Yes [provider]  DULoxetine (CYMBALTA) 60 MG capsule Take 60 mg by mouth daily.   Yes [provider]  ipratropium (ATROVENT) 0.06 % nasal spray Place 2 sprays into both nostrils 4 (four) times daily. 03/11/24  Yes Assunta Pupo, DO  LORazepam (ATIVAN) 0.5 MG tablet Take 0.5 mg by mouth every 4 (four) hours as needed for anxiety.   Yes [provider]  metoprolol succinate (TOPROL-XL) 25 MG 24 hr tablet Take 25 mg by mouth daily.   Yes [provider]  Multiple Vitamin (MULTIVITAMIN) tablet Take 1 tablet by mouth daily.   Yes [provider]  omeprazole (PRILOSEC) 20 MG capsule Take 20 mg by mouth daily.   Yes [provider]  vitamin B-12 (CYANOCOBALAMIN) 1000 MCG  tablet Take 1,000 mcg by mouth daily.   Yes [provider]  albuterol (VENTOLIN HFA) 108 (90 Base) MCG/ACT inhaler Inhale 2 puffs into the lungs every 4 (four) hours as needed for wheezing or shortness of breath. 03/11/24   Xochil Shanker, DO  diclofenac (VOLTAREN) 75 MG EC tablet Take 75 mg by mouth 2 (two) times daily as needed. 03/20/21   [provider]    Family History Family History  Problem Relation Age of Onset   Breast cancer Mother 68   Breast cancer Paternal Grandmother 19    Social History Social History   Tobacco Use   Smoking status: Never   Smokeless tobacco: Never  Vaping Use   Vaping status: Never Used  Substance Use Topics   Alcohol use: No   Drug use: No     Allergies   Patient has no known allergies.   Review of Systems Review of Systems: negative unless otherwise stated in HPI.       Physical Exam Triage Vital Signs ED Triage Vitals  Encounter Vitals Group     BP      Systolic BP Percentile      Diastolic BP Percentile      Pulse      Resp      Temp      Temp src      SpO2      Weight      Height      Head Circumference      Peak Flow      Pain Score      Pain Loc      Pain Education      Exclude from Growth Chart    No data found.  Updated Vital Signs BP 118/84 (BP Location: Left Arm)   Pulse (!) 104   Temp 99 F (37.2 C) (Oral)   Resp 18   Ht 5\' 9"  (1.753 m)   Wt (!) 139 kg   SpO2 94%   BMI 45.25 kg/m   Visual Acuity Right Eye Distance:   Left Eye Distance:   Bilateral Distance:    Right Eye Near:   Left Eye Near:    Bilateral Near:     Physical Exam GEN:     alert, non-toxic appearing female in no distress    HENT:  mucus membranes moist, oropharyngeal without lesions or erythema, no tonsils visible,  moderate erythematous edematous turbinates, clear nasal discharge, no frontal sinus tenderness, ethmoid and maxillary sinus tenderness present, bilateral TM normal EYES:   pupils equal and reactive, no scleral injection or discharge NECK:  normal ROM, no meningismus   RESP:  no increased work of breathing, clear to auscultation bilaterally CVS:   regular  rhythm, tachycardic Skin:   warm and dry, no rash on visible skin    UC Treatments / Results  Labs (all labs ordered are listed, but only abnormal results are displayed) Labs Reviewed  SARS CORONAVIRUS 2 BY RT PCR    EKG   Radiology No results found.  Procedures Procedures (including critical care time)  Medications Ordered in UC Medications  ibuprofen (ADVIL) tablet 800 mg (800 mg Oral Given 03/11/24 0935)    Initial Impression / Assessment and Plan / UC Course  I have reviewed the triage vital signs and the nursing notes.  Pertinent labs & imaging results that were available during my care of the patient were reviewed by me and considered in my medical  decision making (see chart  for details).       Pt is a 58 y.o. female who presents for 3 days of respiratory symptoms. Naomii is tachycardic but afebrile here without recent antipyretics. Satting between 94 and 96% on room air. Overall pt is non-toxic appearing, well hydrated, without respiratory distress. Pulmonary exam is unremarkable.  COVID obtained and was negative. History consistent with viral respiratory illness. Discussed symptomatic treatment.  Explained lack of efficacy of antibiotics in viral disease.  Atrovent nasal spray for nasal congestion.  Albuterol as she has history of asthma and satting lower on room air.  Typical duration of symptoms discussed.   Return and ED precautions given and voiced understanding. Discussed MDM, treatment plan and plan for follow-up with patient who agrees with plan.     Final Clinical Impressions(s) / UC Diagnoses   Final diagnoses:  Acute upper respiratory infection  History of asthma     Discharge Instructions      Stop by the pharmacy to pick up your prescriptions.  Follow up with your primary care provider or return to the urgent care, if not improving.   I will contact you if your COVID test is positive.  Please quarantine while you wait for the results.  If your test is negative you may resume normal activities. You have a viral respiratory infection that will gradually improve over the next 7-10 days. Cough may last up to 3 weeks.   If your were prescribed medication, stop by the pharmacy to pick them up.   You can take Tylenol  and/or Ibuprofen as needed for fever reduction and pain relief.    For sore throat: try warm salt water  gargles, Mucinex sore throat cough drops or cepacol lozenges, throat spray, warm tea or water  with lemon/honey, popsicles or ice, or OTC cold relief medicine for throat discomfort. You can also purchase chloraseptic spray at the pharmacy or dollar store.   For congestion: take a daily anti-histamine like  Zyrtec, Claritin. Use the prescription nasal spray.    It is important to stay hydrated: drink plenty of fluids (water , gatorade/powerade/pedialyte, juices, or teas) to keep your throat moisturized and help further relieve irritation/discomfort.    Return or go to the Emergency Department if symptoms worsen or do not improve in the next few days    ED Prescriptions     Medication Sig Dispense Auth. Provider   albuterol (VENTOLIN HFA) 108 (90 Base) MCG/ACT inhaler Inhale 2 puffs into the lungs every 4 (four) hours as needed for wheezing or shortness of breath. 6.7 g Samie Barclift, DO   ipratropium (ATROVENT) 0.06 % nasal spray Place 2 sprays into both nostrils 4 (four) times daily. 15 mL Jarmar Rousseau, DO      PDMP not reviewed this encounter.   Fidel Huddle, DO 03/11/24 1027

## 2024-03-11 NOTE — Discharge Instructions (Signed)
 Stop by the pharmacy to pick up your prescriptions.  Follow up with your primary care provider or return to the urgent care, if not improving.   I will contact you if your COVID test is positive.  Please quarantine while you wait for the results.  If your test is negative you may resume normal activities. You have a viral respiratory infection that will gradually improve over the next 7-10 days. Cough may last up to 3 weeks.   If your were prescribed medication, stop by the pharmacy to pick them up.   You can take Tylenol  and/or Ibuprofen as needed for fever reduction and pain relief.    For sore throat: try warm salt water  gargles, Mucinex sore throat cough drops or cepacol lozenges, throat spray, warm tea or water  with lemon/honey, popsicles or ice, or OTC cold relief medicine for throat discomfort. You can also purchase chloraseptic spray at the pharmacy or dollar store.   For congestion: take a daily anti-histamine like Zyrtec, Claritin. Use the prescription nasal spray.    It is important to stay hydrated: drink plenty of fluids (water , gatorade/powerade/pedialyte, juices, or teas) to keep your throat moisturized and help further relieve irritation/discomfort.    Return or go to the Emergency Department if symptoms worsen or do not improve in the next few days

## 2024-05-22 ENCOUNTER — Other Ambulatory Visit: Payer: Self-pay | Admitting: Obstetrics and Gynecology

## 2024-05-22 DIAGNOSIS — Z1231 Encounter for screening mammogram for malignant neoplasm of breast: Secondary | ICD-10-CM

## 2024-06-21 ENCOUNTER — Ambulatory Visit
Admission: RE | Admit: 2024-06-21 | Discharge: 2024-06-21 | Disposition: A | Discharge status: Home / Self Care | Source: Ambulatory Visit | Attending: Obstetrics and Gynecology | Admitting: Obstetrics and Gynecology

## 2024-06-21 DIAGNOSIS — Z1231 Encounter for screening mammogram for malignant neoplasm of breast: Secondary | ICD-10-CM | POA: Insufficient documentation

## 2024-07-01 ENCOUNTER — Ambulatory Visit: Payer: Self-pay | Admitting: Obstetrics and Gynecology
